# Patient Record
Sex: Female | Born: 1985 | Race: Black or African American | Hispanic: No | Marital: Single | Smoking: Current every day smoker
Health system: Southern US, Community
[De-identification: ages and names within clinical notes are randomized; demographics above are authoritative.]

## PROBLEM LIST (undated history)

## (undated) DIAGNOSIS — N139 Obstructive and reflux uropathy, unspecified: Secondary | ICD-10-CM

## (undated) DIAGNOSIS — H53001 Unspecified amblyopia, right eye: Secondary | ICD-10-CM

## (undated) DIAGNOSIS — M419 Scoliosis, unspecified: Secondary | ICD-10-CM

## (undated) DIAGNOSIS — I10 Essential (primary) hypertension: Secondary | ICD-10-CM

## (undated) HISTORY — PX: EYE SURGERY: SHX253

## (undated) HISTORY — PX: OTHER SURGICAL HISTORY: SHX169

## (undated) HISTORY — PX: REFRACTIVE SURGERY: SHX103

## (undated) HISTORY — PX: TUBAL LIGATION: SHX77

## (undated) HISTORY — PX: PILONIDAL CYST EXCISION: SHX744

---

## 2004-09-22 ENCOUNTER — Emergency Department: Payer: Self-pay | Admitting: Emergency Medicine

## 2004-11-27 ENCOUNTER — Emergency Department: Payer: Self-pay | Admitting: Emergency Medicine

## 2004-12-01 ENCOUNTER — Emergency Department: Payer: Self-pay | Admitting: Emergency Medicine

## 2005-01-14 ENCOUNTER — Emergency Department: Payer: Self-pay | Admitting: Emergency Medicine

## 2005-04-03 ENCOUNTER — Emergency Department: Payer: Self-pay | Admitting: Emergency Medicine

## 2005-05-04 ENCOUNTER — Emergency Department: Payer: Self-pay | Admitting: Emergency Medicine

## 2005-05-10 ENCOUNTER — Emergency Department: Payer: Self-pay | Admitting: General Practice

## 2005-05-11 ENCOUNTER — Ambulatory Visit: Payer: Self-pay | Admitting: Emergency Medicine

## 2005-06-06 ENCOUNTER — Emergency Department: Payer: Self-pay | Admitting: Emergency Medicine

## 2005-09-01 ENCOUNTER — Observation Stay: Payer: Self-pay

## 2005-09-13 ENCOUNTER — Emergency Department: Payer: Self-pay | Admitting: Emergency Medicine

## 2005-10-19 ENCOUNTER — Observation Stay: Payer: Self-pay | Admitting: Obstetrics and Gynecology

## 2005-11-12 ENCOUNTER — Encounter: Payer: Self-pay | Admitting: Family Medicine

## 2005-12-04 ENCOUNTER — Inpatient Hospital Stay: Payer: Self-pay

## 2006-03-18 ENCOUNTER — Emergency Department: Payer: Self-pay | Admitting: Emergency Medicine

## 2006-08-18 ENCOUNTER — Emergency Department: Payer: Self-pay | Admitting: Emergency Medicine

## 2006-11-18 ENCOUNTER — Emergency Department: Payer: Self-pay | Admitting: Internal Medicine

## 2006-12-14 ENCOUNTER — Emergency Department: Payer: Self-pay | Admitting: Emergency Medicine

## 2007-05-10 ENCOUNTER — Emergency Department: Payer: Self-pay | Admitting: Emergency Medicine

## 2007-05-12 ENCOUNTER — Emergency Department: Payer: Self-pay | Admitting: Internal Medicine

## 2009-08-12 ENCOUNTER — Emergency Department: Payer: Self-pay | Admitting: Emergency Medicine

## 2009-08-17 ENCOUNTER — Emergency Department: Payer: Self-pay | Admitting: Emergency Medicine

## 2010-05-06 ENCOUNTER — Emergency Department: Payer: Self-pay | Admitting: Emergency Medicine

## 2010-05-28 ENCOUNTER — Emergency Department: Payer: Self-pay | Admitting: Emergency Medicine

## 2010-06-12 ENCOUNTER — Emergency Department: Payer: Self-pay | Admitting: Unknown Physician Specialty

## 2010-09-22 ENCOUNTER — Emergency Department: Payer: Self-pay | Admitting: Emergency Medicine

## 2010-11-26 ENCOUNTER — Emergency Department: Payer: Self-pay | Admitting: Unknown Physician Specialty

## 2010-12-03 ENCOUNTER — Emergency Department: Payer: Self-pay | Admitting: Emergency Medicine

## 2010-12-25 ENCOUNTER — Emergency Department: Payer: Self-pay | Admitting: Emergency Medicine

## 2010-12-29 ENCOUNTER — Emergency Department: Payer: Self-pay | Admitting: Emergency Medicine

## 2011-01-14 ENCOUNTER — Emergency Department: Payer: Self-pay | Admitting: Unknown Physician Specialty

## 2011-02-06 ENCOUNTER — Emergency Department: Payer: Self-pay | Admitting: Emergency Medicine

## 2011-03-13 ENCOUNTER — Emergency Department: Payer: Self-pay | Admitting: *Deleted

## 2011-04-26 ENCOUNTER — Emergency Department: Payer: Self-pay | Admitting: Emergency Medicine

## 2011-07-02 ENCOUNTER — Emergency Department: Payer: Self-pay | Admitting: *Deleted

## 2011-09-07 ENCOUNTER — Emergency Department: Payer: Self-pay | Admitting: Emergency Medicine

## 2011-09-08 LAB — URINALYSIS, COMPLETE
Bilirubin,UR: NEGATIVE
Glucose,UR: NEGATIVE mg/dL (ref 0–75)
Leukocyte Esterase: NEGATIVE
Ph: 5 (ref 4.5–8.0)
RBC,UR: 2 /HPF (ref 0–5)
Squamous Epithelial: 3
WBC UR: 1 /HPF (ref 0–5)

## 2011-09-08 LAB — PREGNANCY, URINE: Pregnancy Test, Urine: NEGATIVE m[IU]/mL

## 2012-01-15 ENCOUNTER — Emergency Department: Payer: Self-pay | Admitting: *Deleted

## 2012-01-30 ENCOUNTER — Emergency Department: Payer: Self-pay | Admitting: Emergency Medicine

## 2012-03-11 ENCOUNTER — Emergency Department: Payer: Self-pay | Admitting: Emergency Medicine

## 2012-03-11 LAB — URINALYSIS, COMPLETE
Bilirubin,UR: NEGATIVE
Glucose,UR: NEGATIVE mg/dL (ref 0–75)
Ketone: NEGATIVE
Nitrite: NEGATIVE
Ph: 5 (ref 4.5–8.0)
Protein: NEGATIVE
Specific Gravity: 1.026 (ref 1.003–1.030)
Squamous Epithelial: 9
WBC UR: 10 /HPF (ref 0–5)

## 2012-03-11 LAB — PREGNANCY, URINE: Pregnancy Test, Urine: NEGATIVE m[IU]/mL

## 2012-03-13 LAB — URINE CULTURE

## 2012-04-23 ENCOUNTER — Emergency Department: Payer: Self-pay | Admitting: Unknown Physician Specialty

## 2012-09-21 ENCOUNTER — Emergency Department: Payer: Self-pay | Admitting: Internal Medicine

## 2012-09-23 ENCOUNTER — Emergency Department: Payer: Self-pay | Admitting: Emergency Medicine

## 2013-02-13 ENCOUNTER — Emergency Department: Payer: Self-pay | Admitting: Emergency Medicine

## 2013-03-19 ENCOUNTER — Emergency Department: Payer: Self-pay | Admitting: Emergency Medicine

## 2013-07-25 ENCOUNTER — Emergency Department: Payer: Self-pay | Admitting: Emergency Medicine

## 2013-07-25 LAB — RAPID INFLUENZA A&B ANTIGENS

## 2013-12-17 ENCOUNTER — Emergency Department: Payer: Self-pay | Admitting: Emergency Medicine

## 2013-12-17 LAB — URINALYSIS, COMPLETE
Bilirubin,UR: NEGATIVE
Blood: NEGATIVE
Glucose,UR: NEGATIVE mg/dL (ref 0–75)
Ketone: NEGATIVE
LEUKOCYTE ESTERASE: NEGATIVE
Nitrite: NEGATIVE
PH: 7 (ref 4.5–8.0)
PROTEIN: NEGATIVE
SPECIFIC GRAVITY: 1.016 (ref 1.003–1.030)
Squamous Epithelial: 11
WBC UR: 1 /HPF (ref 0–5)

## 2013-12-17 LAB — PREGNANCY, URINE: Pregnancy Test, Urine: NEGATIVE m[IU]/mL

## 2013-12-27 ENCOUNTER — Emergency Department: Payer: Self-pay | Admitting: Emergency Medicine

## 2013-12-27 LAB — COMPREHENSIVE METABOLIC PANEL
ALK PHOS: 84 U/L
ALT: 19 U/L (ref 12–78)
ANION GAP: 4 — AB (ref 7–16)
AST: 21 U/L (ref 15–37)
Albumin: 3.6 g/dL (ref 3.4–5.0)
BUN: 12 mg/dL (ref 7–18)
Bilirubin,Total: 0.2 mg/dL (ref 0.2–1.0)
CO2: 30 mmol/L (ref 21–32)
Calcium, Total: 8.9 mg/dL (ref 8.5–10.1)
Chloride: 105 mmol/L (ref 98–107)
Creatinine: 0.7 mg/dL (ref 0.60–1.30)
EGFR (Non-African Amer.): >60
Glucose: 76 mg/dL (ref 65–99)
Osmolality: 276 (ref 275–301)
Potassium: 3.6 mmol/L (ref 3.5–5.1)
Sodium: 139 mmol/L (ref 136–145)
Total Protein: 7.7 g/dL (ref 6.4–8.2)

## 2013-12-27 LAB — CBC
HCT: 36.8 % (ref 35.0–47.0)
HGB: 12 g/dL (ref 12.0–16.0)
MCH: 27.3 pg (ref 26.0–34.0)
MCHC: 32.8 g/dL (ref 32.0–36.0)
MCV: 83 fL (ref 80–100)
Platelet: 393 10*3/uL (ref 150–440)
RBC: 4.41 10*6/uL (ref 3.80–5.20)
RDW: 15.2 % — ABNORMAL HIGH (ref 11.5–14.5)
WBC: 9.1 10*3/uL (ref 3.6–11.0)

## 2013-12-27 LAB — HCG, QUANTITATIVE, PREGNANCY: Beta Hcg, Quant.: <1 m[IU]/mL — ABNORMAL LOW

## 2013-12-27 LAB — URINALYSIS, COMPLETE
Bilirubin,UR: NEGATIVE
Glucose,UR: NEGATIVE mg/dL (ref 0–75)
Ketone: NEGATIVE
Leukocyte Esterase: NEGATIVE
NITRITE: NEGATIVE
PH: 5 (ref 4.5–8.0)
SPECIFIC GRAVITY: 1.031 (ref 1.003–1.030)

## 2013-12-27 LAB — LIPASE, BLOOD: LIPASE: 124 U/L (ref 73–393)

## 2013-12-28 LAB — URINE CULTURE

## 2014-04-10 ENCOUNTER — Emergency Department: Payer: Self-pay | Admitting: Student

## 2014-04-10 LAB — URINALYSIS, COMPLETE
Bacteria: NONE SEEN
Bilirubin,UR: NEGATIVE
Glucose,UR: NEGATIVE mg/dL (ref 0–75)
Ketone: NEGATIVE
Leukocyte Esterase: NEGATIVE
Nitrite: NEGATIVE
Ph: 6 (ref 4.5–8.0)
Protein: NEGATIVE
Specific Gravity: 1.023 (ref 1.003–1.030)

## 2014-04-10 LAB — WET PREP, GENITAL

## 2014-07-18 ENCOUNTER — Emergency Department: Payer: Self-pay | Admitting: Internal Medicine

## 2014-07-18 LAB — GC/CHLAMYDIA PROBE AMP

## 2014-07-18 LAB — URINALYSIS, COMPLETE
Bilirubin,UR: NEGATIVE
GLUCOSE, UR: NEGATIVE mg/dL (ref 0–75)
KETONE: NEGATIVE
NITRITE: NEGATIVE
PH: 6 (ref 4.5–8.0)
Protein: NEGATIVE
SPECIFIC GRAVITY: 1.019 (ref 1.003–1.030)
Squamous Epithelial: 18

## 2014-07-18 LAB — WET PREP, GENITAL

## 2014-08-23 ENCOUNTER — Emergency Department: Payer: Self-pay | Admitting: Emergency Medicine

## 2014-10-31 ENCOUNTER — Emergency Department: Payer: Self-pay

## 2014-10-31 ENCOUNTER — Emergency Department
Admission: EM | Admit: 2014-10-31 | Discharge: 2014-10-31 | Disposition: A | Discharge status: Home / Self Care | Payer: Self-pay | Attending: Emergency Medicine | Admitting: Emergency Medicine

## 2014-10-31 ENCOUNTER — Encounter: Payer: Self-pay | Admitting: Emergency Medicine

## 2014-10-31 DIAGNOSIS — I10 Essential (primary) hypertension: Secondary | ICD-10-CM | POA: Insufficient documentation

## 2014-10-31 DIAGNOSIS — S8392XA Sprain of unspecified site of left knee, initial encounter: Secondary | ICD-10-CM | POA: Insufficient documentation

## 2014-10-31 DIAGNOSIS — Y998 Other external cause status: Secondary | ICD-10-CM | POA: Insufficient documentation

## 2014-10-31 DIAGNOSIS — Y9289 Other specified places as the place of occurrence of the external cause: Secondary | ICD-10-CM | POA: Insufficient documentation

## 2014-10-31 DIAGNOSIS — X58XXXA Exposure to other specified factors, initial encounter: Secondary | ICD-10-CM | POA: Insufficient documentation

## 2014-10-31 DIAGNOSIS — Y9389 Activity, other specified: Secondary | ICD-10-CM | POA: Insufficient documentation

## 2014-10-31 DIAGNOSIS — Z720 Tobacco use: Secondary | ICD-10-CM | POA: Insufficient documentation

## 2014-10-31 HISTORY — DX: Essential (primary) hypertension: I10

## 2014-10-31 HISTORY — DX: Scoliosis, unspecified: M41.9

## 2014-10-31 MED ORDER — TRAMADOL HCL 50 MG PO TABS
ORAL_TABLET | ORAL | Status: AC
  Administered 2014-10-31: 50 mg via ORAL
  Filled 2014-10-31: qty 1

## 2014-10-31 MED ORDER — TRAMADOL HCL 50 MG PO TABS: 50.0000 mg | ORAL_TABLET | Freq: Four times a day (QID) | ORAL | Status: DC | PRN

## 2014-10-31 MED ORDER — IBUPROFEN 400 MG PO TABS
ORAL_TABLET | ORAL | Status: AC
  Administered 2014-10-31: 800 mg via ORAL
  Filled 2014-10-31: qty 2

## 2014-10-31 MED ORDER — IBUPROFEN 800 MG PO TABS: 800.0000 mg | ORAL_TABLET | Freq: Three times a day (TID) | ORAL | Status: DC | PRN

## 2014-10-31 MED ORDER — TRAMADOL HCL 50 MG PO TABS
50.0000 mg | ORAL_TABLET | Freq: Once | ORAL | Status: AC
  Administered 2014-10-31: 50 mg via ORAL

## 2014-10-31 MED ORDER — IBUPROFEN 800 MG PO TABS
800.0000 mg | ORAL_TABLET | Freq: Once | ORAL | Status: AC
  Administered 2014-10-31: 800 mg via ORAL

## 2014-10-31 NOTE — Discharge Instructions (Signed)
Cryotherapy Cryotherapy is when you put ice on your injury. Ice helps lessen pain and puffiness (swelling) after an injury. Ice works the best when you start using it in the first 24 to 48 hours after an injury. HOME CARE  Put a dry or damp towel between the ice pack and your skin.  You may press gently on the ice pack.  Leave the ice on for no more than 10 to 20 minutes at a time.  Check your skin after 5 minutes to make sure your skin is okay.  Rest at least 20 minutes between ice pack uses.  Stop using ice when your skin loses feeling (numbness).  Do not use ice on someone who cannot tell you when it hurts. This includes small children and people with memory problems (dementia). GET HELP RIGHT AWAY IF:  You have white spots on your skin.  Your skin turns blue or pale.  Your skin feels waxy or hard.  Your puffiness gets worse. MAKE SURE YOU:   Understand these instructions.  Will watch your condition.  Will get help right away if you are not doing well or get worse. Document Released: 11/28/2007 Document Revised: 09/03/2011 Document Reviewed: 02/01/2011 [facility] Patient Information 2015 [place], [place]. This information is not intended to replace advice given to you by your health care provider. Make sure you discuss any questions you have with your health care provider.  Knee Bracing Knee braces are supports to help stabilize and protect an injured or painful knee. They come in many different styles. They should support and protect the knee without increasing the chance of other injuries to yourself or others. It is important not to have a false sense of security when using a brace. Knee braces that help you to keep using your knee:  Do not restore normal knee stability under high stress forces.  May decrease some aspects of athletic performance. Some of the different types of knee braces are:  Prophylactic knee braces are designed to prevent or reduce the severity of  knee injuries during sports that make injury to the knee more likely.  Rehabilitative knee braces are designed to allow protected motion of:  Injured knees.  Knees that have been treated with or without surgery. There is no evidence that the use of a supportive knee brace protects the graft following a successful anterior cruciate ligament (ACL) reconstruction. However, braces are sometimes used to:   Protect injured ligaments.  Control knee movement during the initial healing period. They may be used as part of the treatment program for the various injured ligaments or cartilage of the knee including the:  Anterior cruciate ligament.  Medial collateral ligament.  Medial or lateral cartilage (meniscus).  Posterior cruciate ligament.  Lateral collateral ligament. Rehabilitative knee braces are most commonly used:  During crutch-assisted walking right after injury.  During crutch-assisted walking right after surgery to repair the cartilage and/or cruciate ligament injury.  For a short period of time, 2-8 weeks, after the injury or surgery. The value of a rehabilitative brace as opposed to a cast or splint includes the:  Ability to adjust the brace for swelling.  Ability to remove the brace for examinations, icing, or showering.  Ability to allow for movement in a controlled range of motion. Functional knee braces give support to knees that have already been injured. They are designed to provide stability for the injured knee and provide protection after repair. Functional knee braces may not affect performance much. Lower extremity muscle  strengthening, flexibility, and improvement in technique are more important than bracing in treating ligamentous knee injuries. Functional braces are not a substitute for rehabilitation or surgical procedures. Unloader/off-loader braces are designed to provide pain relief in arthritic knees. Patients with wear and tear arthritis from growing old  or from an old cartilage injury (osteoarthritis) of the knee, and bowlegged (varus) or knock-knee (valgus) deformities, often develop increased pain in the arthritic side due to increased loading. Unloader/off-loader braces are made to reduce uneven loading in such knees. There is reduction in bowing out movement in bowlegged knees when the correct unloader brace is used. Patients with advanced osteoarthritis or severe varus or valgus alignment problems would not likely benefit from bracing. Patellofemoral braces help the kneecap to move smoothly and well centered over the end of the femur in the knee.  Most people who wear knee braces feel that they help. However, there is a lack of scientific evidence that knee braces are helpful at the level needed for athletic participation to prevent injury. In spite of this, athletes report an increase in knee stability, pain relief, performance improvement, and confidence during athletics when using a brace.  Different knee problems require different knee braces:  Your caregiver may suggest one kind of knee brace after knee surgery.  A caregiver may choose another kind of knee brace for support instead of surgery for some types of torn ligaments.  You may also need one for pain in the front of your knee that is not getting better with strengthening and flexibility exercises. Get your caregiver's advice if you want to try a knee brace. The caregiver will advise you on where to get them and provide a prescription when it is needed to fashion and/or fit the brace. Knee braces are the least important part of preventing knee injuries or getting better following injury. Stretching, strengthening and technique improvement are far more important in caring for and preventing knee injuries. When strengthening your knee, increase your activities a little at a time so as not to develop injuries from overuse. Work out an exercise plan with your caregiver and/or physical  therapist to get the best program for you. Do not let a knee brace become a crutch. Always remember, there are no braces which support the knee as well as your original ligaments and cartilage you were born with. Conditioning, proper warm-up, and stretching remain the most important parts of keeping your knees healthy. HOW TO USE A KNEE BRACE  During sports, knee braces should be used as directed by your caregiver.  Make sure that the hinges are where the knee bends.  Straps, tapes, or hook-and-loop tapes should be fastened around your leg as instructed.  You should check the placement of the brace during activities to make sure that it has not moved. Poorly positioned braces can hurt rather than help you.  To work well, a knee brace should be worn during all activities that put you at risk of knee injury.  Warm up properly before beginning athletic activities. HOME CARE INSTRUCTIONS  Knee braces often get damaged during normal use. Replace worn-out braces for maximum benefit.  Clean regularly with soap and water.  Inspect your brace often for wear and tear.  Cover exposed metal to protect others from injury.  Durable materials may cost more, but last longer. SEEK IMMEDIATE MEDICAL CARE IF:   Your knee seems to be getting worse rather than better.  You have increasing pain or swelling in the knee.  You  have problems caused by the knee brace.  You have increased swelling or inflammation (redness or soreness) in your knee.  Your knee becomes warm and more painful and you develop an unexplained temperature over 101F (38.3C). MAKE SURE YOU:   Understand these instructions.  Will watch your condition.  Will get help right away if you are not doing well or get worse. See your caregiver, physical therapist, or orthopedic surgeon for additional information. Document Released: 09/01/2003 Document Revised: 10/26/2013 Document Reviewed: 12/08/2008 [facility] Patient Information  2015 [place], [place]. This information is not intended to replace advice given to you by your health care provider. Make sure you discuss any questions you have with your health care provider.

## 2014-10-31 NOTE — ED Notes (Signed)
Pt reports left knee pain and swelling after down about 8 stairs. Ambulatory with pain. Denies any other injuries. Denies hitting head or LOC>

## 2014-10-31 NOTE — ED Provider Notes (Signed)
[facility] Emergency Department Provider Note  ____________________________________________  Time seen: Approximately 1809  I have reviewed the triage vital signs and the nursing notes.   HISTORY  Chief Complaint Knee Pain    HPI Maria Tapia is a 29 y.o. female that fell down approximately 30 minutes prior to arrival twisting her left knee complains of pain with extension and any type of movement of the knee is here for evaluation rates pain as about 8 to a 10 out of 10 depending on she moves it relieved with rest denies any numbness tingling weakness in the extremity otherwise has no complaints at this time   Past Medical History  Diagnosis Date  . Hypertension   . Scoliosis     There are no active problems to display for this patient.   Past Surgical History  Procedure Laterality Date  . C-cection       Current Outpatient Rx  Name  Route  Sig  Dispense  Refill  . ibuprofen (ADVIL,MOTRIN) 800 MG tablet   Oral   Take 1 tablet (800 mg total) by mouth every 8 (eight) hours as needed.   30 tablet   0   . traMADol (ULTRAM) 50 MG tablet   Oral   Take 1 tablet (50 mg total) by mouth every 6 (six) hours as needed.   12 tablet   0     Allergies Review of patient's allergies indicates no known allergies.  History reviewed. No pertinent family history.  Social History History  Substance Use Topics  . Smoking status: Current Some Day Smoker -- 1.00 packs/day    Types: Cigarettes  . Smokeless tobacco: Not on file  . Alcohol Use: No    Review of Systems Constitutional: No fever/chills Eyes: No visual changes. ENT: No sore throat. Cardiovascular: Denies chest pain. Respiratory: Denies shortness of breath. Gastrointestinal: No abdominal pain.  No nausea, no vomiting.  No diarrhea.  No constipation. Genitourinary: Negative for dysuria. Musculoskeletal: Negative for back pain. Skin: Negative for rash. Neurological: Negative for  headaches, focal weakness or numbness.  10-point ROS otherwise negative.  ____________________________________________   PHYSICAL EXAM:  VITAL SIGNS: ED Triage Vitals  Enc Vitals Group     BP 10/31/14 1741 147/92 mmHg     Pulse Rate 10/31/14 1741 86     Resp 10/31/14 1741 20     Temp 10/31/14 1741 98.8 F (37.1 C)     Temp Source 10/31/14 1741 Oral     SpO2 10/31/14 1741 97 %     Weight 10/31/14 1741 200 lb (90.719 kg)     Height 10/31/14 1741 5' (1.524 m)     Head Cir --      Peak Flow --      Pain Score 10/31/14 1742 10     Pain Loc --      Pain Edu? --      Excl. in GC? --     Constitutional: Alert and oriented. Well appearing and in no acute distress. Eyes: Conjunctivae are normal. PERRL. EOMI. Head: Atraumatic. Nose: No congestion/rhinnorhea. Mouth/Throat: Mucous membranes are moist.  Oropharynx non-erythematous. Neck: No stridor.   Cardiovascular: Normal rate, regular rhythm. Grossly normal heart sounds.  Good peripheral circulation. Respiratory: Normal respiratory effort.  No retractions. Lungs CTAB.  Musculoskeletal: Tenderness with palpation to the medial and anterior aspect of the left knee does have full range of motion passively with pain limited range of motion actively due to pain below the knee she has  full function good distal pulses and strength equal and symmetrical bilaterally Neurologic:  Normal speech and language. No gross focal neurologic deficits are appreciated. Speech is normal. No gait instability. Skin:  Skin is warm, dry and intact. No rash noted. Psychiatric: Mood and affect are normal. Speech and behavior are normal.  ____________________________________________    RADIOLOGY  X-rays of the patient left knee was negative ____________________________________________   PROCEDURES  Procedure(s) performed: None  Critical Care performed: No  ____________________________________________   INITIAL IMPRESSION / ASSESSMENT AND PLAN /  ED COURSE  Pertinent labs & imaging results that were available during my care of the patient were reviewed by me and considered in my medical decision making (see chart for details).   impression this patient left knee sprain patient was given an ice pack in the department given Motrin and tramadol will be placed in a knee immobilizer will be discharged home follow up with orthopedics as soon as possible returning here for any acute concerns or worsening symptoms ____________________________________________   FINAL CLINICAL IMPRESSION(S) / ED DIAGNOSES  Final diagnoses:  Knee sprain, left, initial encounter     [name] Rosalyn GessWilliam C Anayeli Arel, PA-C 10/31/14 1943  Sharman CheekPhillip Stafford, MD 11/03/14 [phone]

## 2014-11-15 ENCOUNTER — Encounter: Payer: Self-pay | Admitting: Emergency Medicine

## 2014-11-15 ENCOUNTER — Emergency Department
Admission: EM | Admit: 2014-11-15 | Discharge: 2014-11-15 | Disposition: A | Discharge status: Home / Self Care | Payer: Self-pay | Attending: Emergency Medicine | Admitting: Emergency Medicine

## 2014-11-15 DIAGNOSIS — H9202 Otalgia, left ear: Secondary | ICD-10-CM | POA: Insufficient documentation

## 2014-11-15 DIAGNOSIS — I10 Essential (primary) hypertension: Secondary | ICD-10-CM | POA: Insufficient documentation

## 2014-11-15 DIAGNOSIS — B9689 Other specified bacterial agents as the cause of diseases classified elsewhere: Secondary | ICD-10-CM | POA: Insufficient documentation

## 2014-11-15 DIAGNOSIS — Z880 Allergy status to penicillin: Secondary | ICD-10-CM | POA: Insufficient documentation

## 2014-11-15 DIAGNOSIS — Z79899 Other long term (current) drug therapy: Secondary | ICD-10-CM | POA: Insufficient documentation

## 2014-11-15 DIAGNOSIS — Z720 Tobacco use: Secondary | ICD-10-CM | POA: Insufficient documentation

## 2014-11-15 DIAGNOSIS — J028 Acute pharyngitis due to other specified organisms: Secondary | ICD-10-CM | POA: Insufficient documentation

## 2014-11-15 LAB — POCT RAPID STREP A
STREPTOCOCCUS, GROUP A SCREEN (DIRECT): NEGATIVE
Streptococcus, Group A Screen (Direct): NEGATIVE

## 2014-11-15 MED ORDER — BENZONATATE 100 MG PO CAPS: 100.0000 mg | ORAL_CAPSULE | Freq: Three times a day (TID) | ORAL | Status: AC | PRN

## 2014-11-15 MED ORDER — AZITHROMYCIN 200 MG/5ML PO SUSR
500.0000 mg | Freq: Once | ORAL | Status: AC
  Administered 2014-11-15: 500 mg via ORAL

## 2014-11-15 MED ORDER — IBUPROFEN 800 MG PO TABS: 800.0000 mg | ORAL_TABLET | Freq: Three times a day (TID) | ORAL | Status: DC | PRN

## 2014-11-15 MED ORDER — AZITHROMYCIN 250 MG PO TABS: 250.0000 mg | ORAL_TABLET | Freq: Every day | ORAL | Status: DC

## 2014-11-15 MED ORDER — AZITHROMYCIN 200 MG/5ML PO SUSR
ORAL | Status: AC
  Administered 2014-11-15: 500 mg via ORAL
  Filled 2014-11-15: qty 1

## 2014-11-15 MED ORDER — AZITHROMYCIN 250 MG PO TABS
ORAL_TABLET | ORAL | Status: DC
Start: 2014-11-15 — End: 2015-12-29

## 2014-11-15 NOTE — ED Notes (Signed)
Pt c/o sore throat and left earache since Friday; says she had a fever ov 103.6 about 45 minutes prior to arrival; did not take any medication; temp currently in triage 98.6;

## 2014-11-15 NOTE — ED Provider Notes (Signed)
[facility] Emergency Department Provider Note  ____________________________________________  Time seen: Approximately 7:22 AM  I have reviewed the triage vital signs and the nursing notes.   HISTORY  Chief Complaint Sore Throat and Otalgia    HPI Maria Tapia is a 29 y.o. female Maria Tapia complains of sore throat and left ear aches 4 days. States she had a fever of 103.6 about 45 minutes prior to arrival. Vital signs noted and reviewed.  Past Medical History  Diagnosis Date  . Hypertension   . Scoliosis     There are no active problems to display for this patient.   Past Surgical History  Procedure Laterality Date  . C-cection     . Eye surgery      Current Outpatient Rx  Name  Route  Sig  Dispense  Refill  . losartan (COZAAR) 25 MG tablet   Oral   Take 25 mg by mouth 2 (two) times daily.         Marland Kitchen azithromycin (ZITHROMAX Z-PAK) 250 MG tablet      Take 2 tablets (500 mg) on  Day 1,  followed by 1 tablet (250 mg) once daily on Days 2 through 5.   6 each   0   . benzonatate (TESSALON PERLES) 100 MG capsule   Oral   Take 1 capsule (100 mg total) by mouth 3 (three) times daily as needed for cough.   30 capsule   0   . ibuprofen (ADVIL,MOTRIN) 800 MG tablet   Oral   Take 1 tablet (800 mg total) by mouth every 8 (eight) hours as needed.   30 tablet   0     Allergies Amoxicillin and Penicillins  No family history on file.  Social History History  Substance Use Topics  . Smoking status: Current Some Day Smoker -- 1.00 packs/day    Types: Cigarettes  . Smokeless tobacco: Never Used  . Alcohol Use: No    Review of Systems Constitutional: No fever/chills Eyes: No visual changes. ENT: Positive sore throat. Cardiovascular: Denies chest pain. Respiratory: Denies shortness of breath. Positive for dry cough Gastrointestinal: No abdominal pain.  No nausea, no vomiting.  No diarrhea.  No constipation. Genitourinary: Negative for  dysuria. Musculoskeletal: Negative for back pain. Skin: Negative for rash. Neurological: Negative for headaches, focal weakness or numbness.  10-point ROS otherwise negative.  ____________________________________________   PHYSICAL EXAM:  VITAL SIGNS: ED Triage Vitals  Enc Vitals Group     BP 11/15/14 0319 144/99 mmHg     Pulse Rate 11/15/14 0319 94     Resp 11/15/14 0319 18     Temp 11/15/14 0319 98.6 F (37 C)     Temp Source 11/15/14 0319 Oral     SpO2 11/15/14 0319 100 %     Weight 11/15/14 0319 205 lb (92.987 kg)     Height 11/15/14 0319  (1.499 m)     Head Cir --      Peak Flow --      Pain Score 11/15/14 0320 10     Pain Loc --      Pain Edu? --      Excl. in GC? --     Constitutional: Alert and oriented. Well appearing and in no acute distress. Eyes: Conjunctivae are normal. PERRL. EOMI. Head: Atraumatic. Nose: No congestion/rhinnorhea. Mouth/Throat: Mucous membranes are moist.  Oropharynx very erythematous with tonsillar edema. Neck: No stridor.   Hematological/Lymphatic/Immunilogical: No cervical lymphadenopathy. Cardiovascular: Normal rate, regular rhythm.  Grossly normal heart sounds.  Good peripheral circulation. Respiratory: Normal respiratory effort.  No retractions. Lungs CTAB. Gastrointestinal: Soft and nontender. No distention. No abdominal bruits. No CVA tenderness. Musculoskeletal: No lower extremity tenderness nor edema.  No joint effusions. Neurologic:  Normal speech and language. No gross focal neurologic deficits are appreciated. Speech is normal. No gait instability. Skin:  Skin is warm, dry and intact. No rash noted. Psychiatric: Mood and affect are normal. Speech and behavior are normal.  ____________________________________________   LABS (all labs ordered are listed, but only abnormal results are displayed)  Labs Reviewed  CULTURE, GROUP A STREP ([facility])  POCT RAPID STREP A  POCT RAPID STREP A    ____________________________________________  EKG  None ____________________________________________  RADIOLOGY  None ____________________________________________   PROCEDURES  Procedure(s) performed: None  Critical Care performed: No  ____________________________________________   INITIAL IMPRESSION / ASSESSMENT AND PLAN / ED COURSE  Pertinent labs & imaging results that were available during my care of the patient were reviewed by me and considered in my medical decision making (see chart for details).  Despite negative rapid A strep, plan is to treat for strep pharyngitis accordingly. Rx given for Zithromax Tessalon Perles and ibuprofen ____________________________________________   FINAL CLINICAL IMPRESSION(S) / ED DIAGNOSES  Final diagnoses:  Acute pharyngitis due to other specified organisms      Evangeline Dakinharles M Arville Postlewaite, PA-C 11/15/14 1547  Jene Everyobert Kinner, MD 11/17/14 1258

## 2014-11-15 NOTE — Discharge Instructions (Signed)
Pharyngitis °Pharyngitis is redness, pain, and swelling (inflammation) of your pharynx.  °CAUSES  °Pharyngitis is usually caused by infection. Most of the time, these infections are from viruses (viral) and are part of a cold. However, sometimes pharyngitis is caused by bacteria (bacterial). Pharyngitis can also be caused by allergies. Viral pharyngitis may be spread from person to person by coughing, sneezing, and personal items or utensils (cups, forks, spoons, toothbrushes). Bacterial pharyngitis may be spread from person to person by more intimate contact, such as kissing.  °SIGNS AND SYMPTOMS  °Symptoms of pharyngitis include:   °· Sore throat.   °· Tiredness (fatigue).   °· Low-grade fever.   °· Headache. °· Joint pain and muscle aches. °· Skin rashes. °· Swollen lymph nodes. °· Plaque-like film on throat or tonsils (often seen with bacterial pharyngitis). °DIAGNOSIS  °Your health care provider will ask you questions about your illness and your symptoms. Your medical history, along with a physical exam, is often all that is needed to diagnose pharyngitis. Sometimes, a rapid strep test is done. Other lab tests may also be done, depending on the suspected cause.  °TREATMENT  °Viral pharyngitis will usually get better in 3-4 days without the use of medicine. Bacterial pharyngitis is treated with medicines that kill germs (antibiotics).  °HOME CARE INSTRUCTIONS  °· Drink enough water and fluids to keep your urine clear or pale yellow.   °· Only take over-the-counter or prescription medicines as directed by your health care provider:   °¨ If you are prescribed antibiotics, make sure you finish them even if you start to feel better.   °¨ Do not take aspirin.   °· Get lots of rest.   °· Gargle with 8 oz of salt water (½ tsp of salt per 1 qt of water) as often as every 1-2 hours to soothe your throat.   °· Throat lozenges (if you are not at risk for choking) or sprays may be used to soothe your throat. °SEEK MEDICAL  CARE IF:  °· You have large, tender lumps in your neck. °· You have a rash. °· You cough up green, yellow-brown, or bloody spit. °SEEK IMMEDIATE MEDICAL CARE IF:  °· Your neck becomes stiff. °· You drool or are unable to swallow liquids. °· You vomit or are unable to keep medicines or liquids down. °· You have severe pain that does not go away with the use of recommended medicines. °· You have trouble breathing (not caused by a stuffy nose). °MAKE SURE YOU:  °· Understand these instructions. °· Will watch your condition. °· Will get help right away if you are not doing well or get worse. °Document Released: 06/11/2005 Document Revised: 04/01/2013 Document Reviewed: 02/16/2013 °ExitCare® Patient Information ©2015 [employer]. This information is not intended to replace advice given to you by your health care provider. Make sure you discuss any questions you have with your health care provider. ° °Upper Respiratory Infection, Adult °An upper respiratory infection (URI) is also sometimes known as the common cold. The upper respiratory tract includes the nose, sinuses, throat, trachea, and bronchi. Bronchi are the airways leading to the lungs. Most people improve within 1 week, but symptoms can last up to 2 weeks. A residual cough may last even longer.  °CAUSES °Many different viruses can infect the tissues lining the upper respiratory tract. The tissues become irritated and inflamed and often become very moist. Mucus production is also common. A cold is contagious. You can easily spread the virus to others by oral contact. This includes kissing, sharing a glass,   coughing, or sneezing. Touching your mouth or nose and then touching a surface, which is then touched by another person, can also spread the virus. °SYMPTOMS  °Symptoms typically develop 1 to 3 days after you come in contact with a cold virus. Symptoms vary from person to person. They may include: °· Runny nose. °· Sneezing. °· Nasal congestion. °· Sinus  irritation. °· Sore throat. °· Loss of voice (laryngitis). °· Cough. °· Fatigue. °· Muscle aches. °· Loss of appetite. °· Headache. °· Low-grade fever. °DIAGNOSIS  °You might diagnose your own cold based on familiar symptoms, since most people get a cold 2 to 3 times a year. Your caregiver can confirm this based on your exam. Most importantly, your caregiver can check that your symptoms are not due to another disease such as strep throat, sinusitis, pneumonia, asthma, or epiglottitis. Blood tests, throat tests, and X-rays are not necessary to diagnose a common cold, but they may sometimes be helpful in excluding other more serious diseases. Your caregiver will decide if any further tests are required. °RISKS AND COMPLICATIONS  °You may be at risk for a more severe case of the common cold if you smoke cigarettes, have chronic heart disease (such as heart failure) or lung disease (such as asthma), or if you have a weakened immune system. The very young and very old are also at risk for more serious infections. Bacterial sinusitis, middle ear infections, and bacterial pneumonia can complicate the common cold. The common cold can worsen asthma and chronic obstructive pulmonary disease (COPD). Sometimes, these complications can require emergency medical care and may be life-threatening. °PREVENTION  °The best way to protect against getting a cold is to practice good hygiene. Avoid oral or hand contact with people with cold symptoms. Wash your hands often if contact occurs. There is no clear evidence that vitamin C, vitamin E, echinacea, or exercise reduces the chance of developing a cold. However, it is always recommended to get plenty of rest and practice good nutrition. °TREATMENT  °Treatment is directed at relieving symptoms. There is no cure. Antibiotics are not effective, because the infection is caused by a virus, not by bacteria. Treatment may include: °· Increased fluid intake. Sports drinks offer valuable  electrolytes, sugars, and fluids. °· Breathing heated mist or steam (vaporizer or shower). °· Eating chicken soup or other clear broths, and maintaining good nutrition. °· Getting plenty of rest. °· Using gargles or lozenges for comfort. °· Controlling fevers with ibuprofen or acetaminophen as directed by your caregiver. °· Increasing usage of your inhaler if you have asthma. °Zinc gel and zinc lozenges, taken in the first 24 hours of the common cold, can shorten the duration and lessen the severity of symptoms. Pain medicines may help with fever, muscle aches, and throat pain. A variety of non-prescription medicines are available to treat congestion and runny nose. Your caregiver can make recommendations and may suggest nasal or lung inhalers for other symptoms.  °HOME CARE INSTRUCTIONS  °· Only take over-the-counter or prescription medicines for pain, discomfort, or fever as directed by your caregiver. °· Use a warm mist humidifier or inhale steam from a shower to increase air moisture. This may keep secretions moist and make it easier to breathe. °· Drink enough water and fluids to keep your urine clear or pale yellow. °· Rest as needed. °· Return to work when your temperature has returned to normal or as your caregiver advises. You may need to stay home longer to avoid infecting others. You   can also use a face mask and careful hand washing to prevent spread of the virus. °SEEK MEDICAL CARE IF:  °· After the first few days, you feel you are getting worse rather than better. °· You need your caregiver's advice about medicines to control symptoms. °· You develop chills, worsening shortness of breath, or brown or red sputum. These may be signs of pneumonia. °· You develop yellow or brown nasal discharge or pain in the face, especially when you bend forward. These may be signs of sinusitis. °· You develop a fever, swollen neck glands, pain with swallowing, or white areas in the back of your throat. These may be signs  of strep throat. °SEEK IMMEDIATE MEDICAL CARE IF:  °· You have a fever. °· You develop severe or persistent headache, ear pain, sinus pain, or chest pain. °· You develop wheezing, a prolonged cough, cough up blood, or have a change in your usual mucus (if you have chronic lung disease). °· You develop sore muscles or a stiff neck. °Document Released: 12/05/2000 Document Revised: 09/03/2011 Document Reviewed: 09/16/2013 °ExitCare® Patient Information ©2015 [employer]. This information is not intended to replace advice given to you by your health care provider. Make sure you discuss any questions you have with your health care provider. ° °

## 2014-11-15 NOTE — ED Notes (Signed)
NEGATIVE RAPID STREP TEST

## 2014-11-17 LAB — CULTURE, GROUP A STREP (THRC)

## 2014-11-22 ENCOUNTER — Encounter: Payer: Self-pay | Admitting: Emergency Medicine

## 2014-11-22 ENCOUNTER — Emergency Department
Admission: EM | Admit: 2014-11-22 | Discharge: 2014-11-22 | Disposition: A | Discharge status: Home / Self Care | Payer: Self-pay | Attending: Emergency Medicine | Admitting: Emergency Medicine

## 2014-11-22 DIAGNOSIS — I10 Essential (primary) hypertension: Secondary | ICD-10-CM | POA: Insufficient documentation

## 2014-11-22 DIAGNOSIS — Z79899 Other long term (current) drug therapy: Secondary | ICD-10-CM | POA: Insufficient documentation

## 2014-11-22 DIAGNOSIS — L0231 Cutaneous abscess of buttock: Secondary | ICD-10-CM | POA: Insufficient documentation

## 2014-11-22 DIAGNOSIS — Z880 Allergy status to penicillin: Secondary | ICD-10-CM | POA: Insufficient documentation

## 2014-11-22 DIAGNOSIS — Z720 Tobacco use: Secondary | ICD-10-CM | POA: Insufficient documentation

## 2014-11-22 MED ORDER — SULFAMETHOXAZOLE-TRIMETHOPRIM 800-160 MG PO TABS: 1.0000 | ORAL_TABLET | Freq: Two times a day (BID) | ORAL | Status: DC

## 2014-11-22 MED ORDER — HYDROCODONE-ACETAMINOPHEN 5-325 MG PO TABS
ORAL_TABLET | ORAL | Status: AC
  Administered 2014-11-22: 1 via ORAL
  Filled 2014-11-22: qty 1

## 2014-11-22 MED ORDER — HYDROCODONE-ACETAMINOPHEN 5-325 MG PO TABS: 1.0000 | ORAL_TABLET | ORAL | Status: DC | PRN

## 2014-11-22 MED ORDER — HYDROCODONE-ACETAMINOPHEN 5-325 MG PO TABS
1.0000 | ORAL_TABLET | Freq: Once | ORAL | Status: AC
  Administered 2014-11-22: 1 via ORAL

## 2014-11-22 NOTE — ED Provider Notes (Signed)
[facility] Emergency Department Provider Note  ____________________________________________  Time seen: 1405  I have reviewed the triage vital signs and the nursing notes.   HISTORY  Chief Complaint Abscess   HPI Maria Tapia is a 29 y.o. female comes in today with complaint of an abscess on her buttocks for the last 2 or 3 days. She was recently seen for strep throat and states she understand why she has an abscess on her butt now. She rates her pain as a 9 out of 10.She is not taking any medication for this, but does want pain medication at present. She was recently seen for strep throat. She states she did finish that medication.   Past Medical History  Diagnosis Date  . Hypertension   . Scoliosis     There are no active problems to display for this patient.   Past Surgical History  Procedure Laterality Date  . C-cection     . Eye surgery      Current Outpatient Rx  Name  Route  Sig  Dispense  Refill  . azithromycin (ZITHROMAX Z-PAK) 250 MG tablet      Take 2 tablets (500 mg) on  Day 1,  followed by 1 tablet (250 mg) once daily on Days 2 through 5.   6 each   0   . benzonatate (TESSALON PERLES) 100 MG capsule   Oral   Take 1 capsule (100 mg total) by mouth 3 (three) times daily as needed for cough.   30 capsule   0   . HYDROcodone-acetaminophen (NORCO/VICODIN) 5-325 MG per tablet   Oral   Take 1 tablet by mouth every 4 (four) hours as needed for moderate pain.   20 tablet   0   . ibuprofen (ADVIL,MOTRIN) 800 MG tablet   Oral   Take 1 tablet (800 mg total) by mouth every 8 (eight) hours as needed.   30 tablet   0   . losartan (COZAAR) 25 MG tablet   Oral   Take 25 mg by mouth 2 (two) times daily.         Marland Kitchen sulfamethoxazole-trimethoprim (BACTRIM DS,SEPTRA DS) 800-160 MG per tablet   Oral   Take 1 tablet by mouth 2 (two) times daily.   20 tablet   0     Allergies Amoxicillin and Penicillins  No family history on  file.  Social History History  Substance Use Topics  . Smoking status: Current Some Day Smoker -- 1.00 packs/day    Types: Cigarettes  . Smokeless tobacco: Never Used  . Alcohol Use: No    Review of Systems Constitutional: No fever/chills Eyes: No visual changes. ENT: Positive for recent sore throat. Cardiovascular: Denies chest pain. Respiratory: Denies shortness of breath. Genitourinary: Negative for dysuria. Musculoskeletal: Negative for back pain. Skin: Negative for rash. Neurological: Negative for headaches 10-point ROS otherwise negative.  ____________________________________________   PHYSICAL EXAM:  VITAL SIGNS: ED Triage Vitals  Enc Vitals Group     BP 11/22/14 1336 147/99 mmHg     Pulse Rate 11/22/14 1336 79     Resp 11/22/14 1336 18     Temp 11/22/14 1336 99 F (37.2 C)     Temp Source 11/22/14 1336 Oral     SpO2 11/22/14 1336 97 %     Weight 11/22/14 1336 200 lb (90.719 kg)     Height 11/22/14 1336  (1.499 m)     Head Cir --      Peak Flow --  Pain Score 11/22/14 1337 9     Pain Loc --      Pain Edu? --      Excl. in GC? --     Constitutional: Alert and oriented. Well appearing and in no acute distress. Eyes: Conjunctivae are normal. PERRL. EOMI. Head: Atraumatic. Nose: No congestion/rhinnorhea. Neck: No stridor.   Hematological/Lymphatic/Immunilogical: No cervical lymphadenopathy. Cardiovascular: Normal rate, regular rhythm. Grossly normal heart sounds.  Good peripheral circulation. Respiratory: Normal respiratory effort.  No retractions. Lungs CTAB. Gastrointestinal: Soft and nontender. No distention. No abdominal bruits. No CVA tenderness. Musculoskeletal: No lower extremity tenderness nor edema.  No joint effusions. Neurologic:  Normal speech and language. No gross focal neurologic deficits are appreciated. Speech is normal. No gait instability. Skin:  Skin is warm, dry.  There is a tender area on the medial aspect of her right  buttocks. There is no abscess formation at this time or fluctuant area. There is moderate tenderness and hard on palpation  Psychiatric: Mood and affect are normal. Speech and behavior are normal.  ____________________________________________   LABS (all labs ordered are listed, but only abnormal results are displayed)  Labs Reviewed - No data to display   PROCEDURES  Procedure(s) performed: None  Critical Care performed: No  ____________________________________________   INITIAL IMPRESSION / ASSESSMENT AND PLAN / ED COURSE  Pertinent labs & imaging results that were available during my care of the patient were reviewed by me and considered in my medical decision making (see chart for details).  Patient was started on anabolic consumption for pain. She is to either sit on top warm water use warm compresses. She'll return if area looks worse. She was also given a list of clinics in the area for a family doctor. She states [facility] even though she doesn't go there anymore. ____________________________________________   FINAL CLINICAL IMPRESSION(S) / ED DIAGNOSES  Final diagnoses:  Abscess of buttock, right      Tommi RumpsRhonda L Summers, PA-C 11/22/14 1611  Darien Ramusavid W Kaminski, MD 11/23/14 1213

## 2014-11-22 NOTE — Discharge Instructions (Signed)
Abscess An abscess (boil or furuncle) is an infected area on or under the skin. This area is filled with yellowish-white fluid (pus) and other material (debris). HOME CARE   Only take medicines as told by your doctor.  If you were given antibiotic medicine, take it as directed. Finish the medicine even if you start to feel better.  If gauze is used, follow your doctor's directions for changing the gauze.  To avoid spreading the infection:  Keep your abscess covered with a bandage.  Wash your hands well.  Do not share personal care items, towels, or whirlpools with others.  Avoid skin contact with others.  Keep your skin and clothes clean around the abscess.  Keep all doctor visits as told. GET HELP RIGHT AWAY IF:   You have more pain, puffiness (swelling), or redness in the wound site.  You have more fluid or blood coming from the wound site.  You have muscle aches, chills, or you feel sick.  You have a fever. MAKE SURE YOU:   Understand these instructions.  Will watch your condition.  Will get help right away if you are not doing well or get worse. Document Released: 11/28/2007 Document Revised: 12/11/2011 Document Reviewed: 08/24/2011 [facility] Patient Information 2015 [place] [place]. This information is not intended to replace advice given to you by your health care provider. Make sure you discuss any questions you have with your health care provider.    MAKE AN APPOINTMENT WITH ONE OF THE CLINICS LISTED ON YOUR PAPERS.  YOU WILL NEED TO HAVE YOUR BLOOD PRESSURE RE-CHECKED IN 4-5 DAYS.   TAKE ANTIBIOTIC UNTIL FINISHED.  SOAK IN TUB OF WARM WATER TWICE A DAY OR USE MOIST WARM COMPRESSES TO THE AREA.  TAKE PAIN MEDIATION ONLY AS DIRECTED RETURN TO ER IN 2 DAY IS ANY SOFTNESS TO THE AREA FOR POSSIBLE INCISION AND DRAINAGE

## 2014-11-22 NOTE — ED Notes (Signed)
Poss abscess to buttocks and fever for the past 2-3 days

## 2014-11-24 ENCOUNTER — Emergency Department
Admission: EM | Admit: 2014-11-24 | Discharge: 2014-11-24 | Disposition: A | Discharge status: Home / Self Care | Payer: Self-pay | Attending: Emergency Medicine | Admitting: Emergency Medicine

## 2014-11-24 ENCOUNTER — Encounter: Payer: Self-pay | Admitting: Emergency Medicine

## 2014-11-24 DIAGNOSIS — Z3202 Encounter for pregnancy test, result negative: Secondary | ICD-10-CM | POA: Insufficient documentation

## 2014-11-24 DIAGNOSIS — Z79899 Other long term (current) drug therapy: Secondary | ICD-10-CM | POA: Insufficient documentation

## 2014-11-24 DIAGNOSIS — I10 Essential (primary) hypertension: Secondary | ICD-10-CM | POA: Insufficient documentation

## 2014-11-24 DIAGNOSIS — Z792 Long term (current) use of antibiotics: Secondary | ICD-10-CM | POA: Insufficient documentation

## 2014-11-24 DIAGNOSIS — L03317 Cellulitis of buttock: Secondary | ICD-10-CM | POA: Insufficient documentation

## 2014-11-24 DIAGNOSIS — Z720 Tobacco use: Secondary | ICD-10-CM | POA: Insufficient documentation

## 2014-11-24 LAB — COMPREHENSIVE METABOLIC PANEL
ALBUMIN: 3.4 g/dL — AB (ref 3.5–5.0)
ALK PHOS: 69 U/L (ref 38–126)
ALT: 10 U/L — AB (ref 14–54)
AST: 15 U/L (ref 15–41)
Anion gap: 12 (ref 5–15)
BUN: 10 mg/dL (ref 6–20)
CO2: 26 mmol/L (ref 22–32)
Calcium: 9.2 mg/dL (ref 8.9–10.3)
Chloride: 102 mmol/L (ref 101–111)
Creatinine, Ser: 0.73 mg/dL (ref 0.44–1.00)
GFR calc Af Amer: >60 mL/min (ref >60)
Glucose, Bld: 76 mg/dL (ref 65–99)
Potassium: 4 mmol/L (ref 3.5–5.1)
Sodium: 140 mmol/L (ref 135–145)
Total Bilirubin: 0.5 mg/dL (ref 0.3–1.2)
Total Protein: 7.9 g/dL (ref 6.5–8.1)

## 2014-11-24 LAB — CBC WITH DIFFERENTIAL/PLATELET
Basophils Absolute: 0.1 10*3/uL (ref 0–0.1)
Basophils Relative: 1 %
EOS PCT: 1 %
Eosinophils Absolute: 0.1 10*3/uL (ref 0–0.7)
HEMATOCRIT: 35.2 % (ref 35.0–47.0)
Hemoglobin: 11 g/dL — ABNORMAL LOW (ref 12.0–16.0)
Lymphocytes Relative: 17 %
Lymphs Abs: 2.3 10*3/uL (ref 1.0–3.6)
MCH: 23.6 pg — ABNORMAL LOW (ref 26.0–34.0)
MCHC: 31.2 g/dL — AB (ref 32.0–36.0)
MCV: 75.4 fL — ABNORMAL LOW (ref 80.0–100.0)
MONOS PCT: 7 %
Monocytes Absolute: 1 10*3/uL — ABNORMAL HIGH (ref 0.2–0.9)
NEUTROS ABS: 10.4 10*3/uL — AB (ref 1.4–6.5)
Neutrophils Relative %: 74 %
Platelets: 546 10*3/uL — ABNORMAL HIGH (ref 150–440)
RBC: 4.67 MIL/uL (ref 3.80–5.20)
RDW: 17.3 % — ABNORMAL HIGH (ref 11.5–14.5)
WBC: 13.9 10*3/uL — ABNORMAL HIGH (ref 3.6–11.0)

## 2014-11-24 LAB — URINALYSIS COMPLETE WITH MICROSCOPIC (ARMC ONLY)
Bilirubin Urine: NEGATIVE
GLUCOSE, UA: NEGATIVE mg/dL
Leukocytes, UA: NEGATIVE
NITRITE: NEGATIVE
PH: 6 (ref 5.0–8.0)
Protein, ur: 30 mg/dL — AB
SPECIFIC GRAVITY, URINE: 1.025 (ref 1.005–1.030)

## 2014-11-24 LAB — POCT PREGNANCY, URINE: Preg Test, Ur: NEGATIVE

## 2014-11-24 LAB — LIPASE, BLOOD: Lipase: 29 U/L (ref 22–51)

## 2014-11-24 MED ORDER — METOCLOPRAMIDE HCL 10 MG PO TABS: 10.0000 mg | ORAL_TABLET | Freq: Four times a day (QID) | ORAL | Status: DC | PRN

## 2014-11-24 MED ORDER — SULFAMETHOXAZOLE-TRIMETHOPRIM 800-160 MG PO TABS
ORAL_TABLET | ORAL | Status: AC
  Administered 2014-11-24: 2 via ORAL
  Filled 2014-11-24: qty 2

## 2014-11-24 MED ORDER — HYDROMORPHONE HCL 1 MG/ML IJ SOLN
0.5000 mg | Freq: Once | INTRAMUSCULAR | Status: AC
Start: 2014-11-24 — End: 2014-11-24
  Administered 2014-11-24: 0.5 mg via INTRAMUSCULAR

## 2014-11-24 MED ORDER — HYDROMORPHONE HCL 1 MG/ML IJ SOLN
INTRAMUSCULAR | Status: AC
  Administered 2014-11-24: 0.5 mg via INTRAMUSCULAR
  Filled 2014-11-24: qty 1

## 2014-11-24 MED ORDER — SULFAMETHOXAZOLE-TRIMETHOPRIM 800-160 MG PO TABS
2.0000 | ORAL_TABLET | Freq: Once | ORAL | Status: AC
  Administered 2014-11-24: 2 via ORAL

## 2014-11-24 MED ORDER — ONDANSETRON 4 MG PO TBDP
4.0000 mg | ORAL_TABLET | Freq: Once | ORAL | Status: AC
  Administered 2014-11-24: 4 mg via ORAL

## 2014-11-24 MED ORDER — LIDOCAINE-EPINEPHRINE-TETRACAINE (LET) SOLUTION
NASAL | Status: AC
  Administered 2014-11-24: 3 mL via TOPICAL
  Filled 2014-11-24: qty 3

## 2014-11-24 MED ORDER — OXYCODONE-ACETAMINOPHEN 5-325 MG PO TABS
ORAL_TABLET | ORAL | Status: AC
  Administered 2014-11-24: 1 via ORAL
  Filled 2014-11-24: qty 1

## 2014-11-24 MED ORDER — ONDANSETRON 4 MG PO TBDP
ORAL_TABLET | ORAL | Status: AC
  Filled 2014-11-24: qty 1

## 2014-11-24 MED ORDER — LIDOCAINE-EPINEPHRINE (PF) 1 %-1:200000 IJ SOLN
INTRAMUSCULAR | Status: AC
  Filled 2014-11-24: qty 30

## 2014-11-24 MED ORDER — LIDOCAINE-EPINEPHRINE-TETRACAINE (LET) SOLUTION
3.0000 mL | Freq: Once | NASAL | Status: AC
  Administered 2014-11-24: 3 mL via TOPICAL

## 2014-11-24 MED ORDER — OXYCODONE-ACETAMINOPHEN 5-325 MG PO TABS
1.0000 | ORAL_TABLET | Freq: Once | ORAL | Status: AC
  Administered 2014-11-24: 1 via ORAL

## 2014-11-24 NOTE — ED Notes (Signed)
Was seen 2 days ago for abscess to buttocks  Developed n/v yesterday   Unable to keep any fluids down  Last time vomited about 1 hrs pta

## 2014-11-24 NOTE — Discharge Instructions (Signed)

## 2014-11-24 NOTE — ED Notes (Signed)
Pt states she was seen last week for a cysts on her sacrum, pt states she was given ABX and pain medicine, pt states she also had strep throat, pt states she has been unable to take her medicine and eat or drink for 1 week, pt states she has been out of work from the pain, pt awake and alert resting in bed in no distress upon assessment

## 2014-11-24 NOTE — ED Provider Notes (Signed)
[facility] Emergency Department Provider Note  ____________________________________________  Time seen: Approximately 6 PM  I have reviewed the triage vital signs and the nursing notes.   HISTORY  Chief Complaint Emesis    HPI Maria Tapia is a 29 y.o. female with a history of multiple pilonidal abscesses who presents today with 1 week of worsening sacral pain. Also now with nausea and vomiting. Says prescribed antibiotics in the emergency department previously but unable to take secondary to nausea and vomiting. Denies any fevers. Says has had several episodes of pain and swelling to her sacrum over the past 3 years that resolved spontaneously. Says she makes priority to keep the area clean and has last had to have it drained 3 years ago.  Past Medical History  Diagnosis Date  . Hypertension   . Scoliosis     There are no active problems to display for this patient.   Past Surgical History  Procedure Laterality Date  . C-cection     . Eye surgery      Current Outpatient Rx  Name  Route  Sig  Dispense  Refill  . azithromycin (ZITHROMAX Z-PAK) 250 MG tablet      Take 2 tablets (500 mg) on  Day 1,  followed by 1 tablet (250 mg) once daily on Days 2 through 5.   6 each   0   . benzonatate (TESSALON PERLES) 100 MG capsule   Oral   Take 1 capsule (100 mg total) by mouth 3 (three) times daily as needed for cough.   30 capsule   0   . HYDROcodone-acetaminophen (NORCO/VICODIN) 5-325 MG per tablet   Oral   Take 1 tablet by mouth every 4 (four) hours as needed for moderate pain.   20 tablet   0   . ibuprofen (ADVIL,MOTRIN) 800 MG tablet   Oral   Take 1 tablet (800 mg total) by mouth every 8 (eight) hours as needed.   30 tablet   0   . losartan (COZAAR) 25 MG tablet   Oral   Take 25 mg by mouth 2 (two) times daily.         Marland Kitchen. sulfamethoxazole-trimethoprim (BACTRIM DS,SEPTRA DS) 800-160 MG per tablet   Oral   Take 1 tablet by mouth 2  (two) times daily.   20 tablet   0     Allergies Amoxicillin and Penicillins  No family history on file.  Social History History  Substance Use Topics  . Smoking status: Current Some Day Smoker -- 1.00 packs/day    Types: Cigarettes  . Smokeless tobacco: Never Used  . Alcohol Use: No    Review of Systems Constitutional: No fever/chills Eyes: No visual changes. ENT: No sore throat. Cardiovascular: Denies chest pain. Respiratory: Denies shortness of breath. Gastrointestinal: See above  Genitourinary: Negative for dysuria. Musculoskeletal: Negative for back pain. Skin: See above  Neurological: Negative for headaches, focal weakness or numbness.  10-point ROS otherwise negative.  ____________________________________________   PHYSICAL EXAM:  VITAL SIGNS: ED Triage Vitals  Enc Vitals Group     BP 11/24/14 1419 142/110 mmHg     Pulse Rate 11/24/14 1419 86     Resp 11/24/14 1419 18     Temp 11/24/14 1419 98.1 F (36.7 C)     Temp Source 11/24/14 1419 Oral     SpO2 11/24/14 1419 98 %     Weight 11/24/14 1420 200 lb (90.719 kg)     Height 11/24/14 1420 4\' 11"  (  1.499 m)     Head Cir --      Peak Flow --      Pain Score 11/24/14 1420 9     Pain Loc --      Pain Edu? --      Excl. in GC? --    Constitutional: Alert and oriented. Well appearing and in no acute distress. Eyes: Conjunctivae are normal. PERRL. EOMI. Head: Atraumatic. Nose: No congestion/rhinnorhea. Mouth/Throat: Mucous membranes are moist.  Oropharynx non-erythematous. Neck: No stridor.   Cardiovascular: Normal rate, regular rhythm. Grossly normal heart sounds.  Good peripheral circulation. Respiratory: Normal respiratory effort.  No retractions. Lungs CTAB. Gastrointestinal: Soft and nontender. No distention. No abdominal bruits. No CVA tenderness. Musculoskeletal: No lower extremity tenderness nor edema.  No joint effusions. Neurologic:  Normal speech and language. No gross focal neurologic  deficits are appreciated. Speech is normal. No gait instability. Skin:  Just right of midline superiorly to the buttock there is an area of induration around scar tissue. There is a area of fluctuance medial on the scar tissue which is very tender to palpation. The dimensions of the indurated area about 6 x 4 cm. There is no drainage.   Psychiatric: Mood and affect are normal. Speech and behavior are normal.  ____________________________________________   LABS (all labs ordered are listed, but only abnormal results are displayed)  Labs Reviewed  CBC WITH DIFFERENTIAL/PLATELET - Abnormal; Notable for the following:    WBC 13.9 (*)    Hemoglobin 11.0 (*)    MCV 75.4 (*)    MCH 23.6 (*)    MCHC 31.2 (*)    RDW 17.3 (*)    Platelets 546 (*)    Neutro Abs 10.4 (*)    Monocytes Absolute 1.0 (*)    All other components within normal limits  COMPREHENSIVE METABOLIC PANEL - Abnormal; Notable for the following:    Albumin 3.4 (*)    ALT 10 (*)    All other components within normal limits  URINALYSIS COMPLETEWITH MICROSCOPIC ([facility] ONLY) - Abnormal; Notable for the following:    Color, Urine YELLOW (*)    APPearance CLOUDY (*)    Ketones, ur 2+ (*)    Hgb urine dipstick 1+ (*)    Protein, ur 30 (*)    Bacteria, UA RARE (*)    Squamous Epithelial / LPF 6-30 (*)    All other components within normal limits  LIPASE, BLOOD  POC URINE PREG, ED  POCT PREGNANCY, URINE   ____________________________________________  EKG   ____________________________________________  RADIOLOGY   ____________________________________________   PROCEDURES  INCISION AND DRAINAGE Performed by: Arelia Longest Consent: Verbal consent obtained. Risks and benefits: risks, benefits and alternatives were discussed Type: abscess  Body area: Right superior buttock just right of midline.  Anesthesia: local infiltration  Incision was made with a scalpel. A 1 cm vertical incision.  Local  anesthetic: lidocaine 1% with epinephrine  Anesthetic total: 4 ml  Complexity:Blunt dissection to break up loculations  Drainage: Bloody   Drainage amount: 2 cc Packing material: No packing  Patient tolerance: Patient tolerated the procedure well with no immediate complications.  No purulent drainage after incision and drainage. Likely changes secondary to cellulitis over scar tissue.     ____________________________________________   INITIAL IMPRESSION / ASSESSMENT AND PLAN / ED COURSE  Pertinent labs & imaging results that were available during my care of the patient were reviewed by me and considered in my medical decision making (see chart for  details).  ----------------------------------------- 7:33 PM on 11/24/2014 -----------------------------------------  Patient tolerated by mouth medications after Zofran. However, does not of insurance. Will discharge with Reglan. Patient also now asking for juice. No vomiting throughout the emergency department stay. Feel that the most important thing for this patient to do is take her antibiotics for resolution of her cellulitis. We'll discharge to home. We'll get follow-up with the [facility] clinic. Patient does not have a known PCP. Has Bactrim and Vicodin at home for pain control. ____________________________________________   FINAL CLINICAL IMPRESSION(S) / ED DIAGNOSES  Acute cellulitis to the buttocks. Return visit.    Myrna Blazer, MD 11/24/14 [id number]

## 2015-04-24 ENCOUNTER — Emergency Department
Admission: EM | Admit: 2015-04-24 | Discharge: 2015-04-24 | Disposition: A | Discharge status: Home / Self Care | Payer: Self-pay | Attending: Emergency Medicine | Admitting: Emergency Medicine

## 2015-04-24 ENCOUNTER — Encounter: Payer: Self-pay | Admitting: Emergency Medicine

## 2015-04-24 DIAGNOSIS — H0520 Unspecified exophthalmos: Secondary | ICD-10-CM | POA: Insufficient documentation

## 2015-04-24 DIAGNOSIS — M79662 Pain in left lower leg: Secondary | ICD-10-CM | POA: Insufficient documentation

## 2015-04-24 DIAGNOSIS — Z792 Long term (current) use of antibiotics: Secondary | ICD-10-CM | POA: Insufficient documentation

## 2015-04-24 DIAGNOSIS — Z720 Tobacco use: Secondary | ICD-10-CM | POA: Insufficient documentation

## 2015-04-24 DIAGNOSIS — Z3202 Encounter for pregnancy test, result negative: Secondary | ICD-10-CM | POA: Insufficient documentation

## 2015-04-24 DIAGNOSIS — M79661 Pain in right lower leg: Secondary | ICD-10-CM | POA: Insufficient documentation

## 2015-04-24 DIAGNOSIS — R109 Unspecified abdominal pain: Secondary | ICD-10-CM | POA: Insufficient documentation

## 2015-04-24 DIAGNOSIS — Z79899 Other long term (current) drug therapy: Secondary | ICD-10-CM | POA: Insufficient documentation

## 2015-04-24 DIAGNOSIS — I10 Essential (primary) hypertension: Secondary | ICD-10-CM | POA: Insufficient documentation

## 2015-04-24 DIAGNOSIS — H509 Unspecified strabismus: Secondary | ICD-10-CM | POA: Insufficient documentation

## 2015-04-24 DIAGNOSIS — Z880 Allergy status to penicillin: Secondary | ICD-10-CM | POA: Insufficient documentation

## 2015-04-24 DIAGNOSIS — M545 Low back pain: Secondary | ICD-10-CM | POA: Insufficient documentation

## 2015-04-24 DIAGNOSIS — E669 Obesity, unspecified: Secondary | ICD-10-CM | POA: Insufficient documentation

## 2015-04-24 DIAGNOSIS — R110 Nausea: Secondary | ICD-10-CM | POA: Insufficient documentation

## 2015-04-24 LAB — COMPREHENSIVE METABOLIC PANEL WITH GFR
ALT: 12 U/L — ABNORMAL LOW (ref 14–54)
AST: 17 U/L (ref 15–41)
Albumin: 3.7 g/dL (ref 3.5–5.0)
Alkaline Phosphatase: 59 U/L (ref 38–126)
Anion gap: 3 — ABNORMAL LOW (ref 5–15)
BUN: 10 mg/dL (ref 6–20)
CO2: 27 mmol/L (ref 22–32)
Calcium: 8.9 mg/dL (ref 8.9–10.3)
Chloride: 106 mmol/L (ref 101–111)
Creatinine, Ser: 0.55 mg/dL (ref 0.44–1.00)
GFR calc Af Amer: >60 mL/min (ref >60)
GFR calc non Af Amer: >60 mL/min (ref >60)
Glucose, Bld: 104 mg/dL — ABNORMAL HIGH (ref 65–99)
Potassium: 3.5 mmol/L (ref 3.5–5.1)
Sodium: 136 mmol/L (ref 135–145)
Total Bilirubin: 0.6 mg/dL (ref 0.3–1.2)
Total Protein: 7.1 g/dL (ref 6.5–8.1)

## 2015-04-24 LAB — URINALYSIS COMPLETE WITH MICROSCOPIC (ARMC ONLY)
Bacteria, UA: NONE SEEN
Bilirubin Urine: NEGATIVE
Glucose, UA: NEGATIVE mg/dL
Ketones, ur: NEGATIVE mg/dL
Leukocytes, UA: NEGATIVE
Nitrite: NEGATIVE
Protein, ur: NEGATIVE mg/dL
Specific Gravity, Urine: 1.017 (ref 1.005–1.030)
pH: 7 (ref 5.0–8.0)

## 2015-04-24 LAB — CBC
HCT: 33.3 % — ABNORMAL LOW (ref 35.0–47.0)
Hemoglobin: 10.5 g/dL — ABNORMAL LOW (ref 12.0–16.0)
MCH: 24.6 pg — ABNORMAL LOW (ref 26.0–34.0)
MCHC: 31.6 g/dL — ABNORMAL LOW (ref 32.0–36.0)
MCV: 77.9 fL — ABNORMAL LOW (ref 80.0–100.0)
Platelets: 375 K/uL (ref 150–440)
RBC: 4.27 MIL/uL (ref 3.80–5.20)
RDW: 18 % — ABNORMAL HIGH (ref 11.5–14.5)
WBC: 6.7 K/uL (ref 3.6–11.0)

## 2015-04-24 LAB — LIPASE, BLOOD: Lipase: 25 U/L (ref 11–51)

## 2015-04-24 LAB — PREGNANCY, URINE: Preg Test, Ur: NEGATIVE

## 2015-04-24 NOTE — ED Provider Notes (Signed)
[facility] Emergency Department Provider Note  ____________________________________________  Time seen: Approximately 3:59 PM  I have reviewed the triage vital signs and the nursing notes.   HISTORY  Chief Complaint Abdominal Pain    HPI Maria Tapia is a 29 y.o. female with a past medical history includes tobacco abuse, morbid obesity and hypertension who does not have a primary care doctor.  She presents with intermittent pain in her lower abdomen, back, and legs for 3 weeks.  She states that is not worse recently, she just has not been able to get a day off of work to go to the doctor.  She has occasional nausea but no vomiting.  She denies dysuria, vaginal bleeding, vaginal discharge, and pelvic pain.  She does state that her last menstrual period was about 2 weeks ago.  She denies fever/chills, chest pain, shortness of breath, upper abdominal pain.She describes the symptoms as gradual in onset, intermittent, mild to moderate in intensity, nothing makes them better or worse.  She recently went to the health department and had a pelvic exam which she states was normal.   Past Medical History  Diagnosis Date  . Hypertension   . Scoliosis     There are no active problems to display for this patient.   Past Surgical History  Procedure Laterality Date  . C-cection     . Eye surgery      Current Outpatient Rx  Name  Route  Sig  Dispense  Refill  . azithromycin (ZITHROMAX Z-PAK) 250 MG tablet      Take 2 tablets (500 mg) on  Day 1,  followed by 1 tablet (250 mg) once daily on Days 2 through 5.   6 each   0   . benzonatate (TESSALON PERLES) 100 MG capsule   Oral   Take 1 capsule (100 mg total) by mouth 3 (three) times daily as needed for cough.   30 capsule   0   . HYDROcodone-acetaminophen (NORCO/VICODIN) 5-325 MG per tablet   Oral   Take 1 tablet by mouth every 4 (four) hours as needed for moderate pain.   20 tablet   0   . ibuprofen  (ADVIL,MOTRIN) 800 MG tablet   Oral   Take 1 tablet (800 mg total) by mouth every 8 (eight) hours as needed.   30 tablet   0   . losartan (COZAAR) 25 MG tablet   Oral   Take 25 mg by mouth 2 (two) times daily.         . metoCLOPramide (REGLAN) 10 MG tablet   Oral   Take 1 tablet (10 mg total) by mouth every 6 (six) hours as needed for nausea or vomiting.   12 tablet   1   . sulfamethoxazole-trimethoprim (BACTRIM DS,SEPTRA DS) 800-160 MG per tablet   Oral   Take 1 tablet by mouth 2 (two) times daily.   20 tablet   0     Allergies Amoxicillin and Penicillins  No family history on file.  Social History Social History  Substance Use Topics  . Smoking status: Current Some Day Smoker -- 1.00 packs/day    Types: Cigarettes  . Smokeless tobacco: Never Used  . Alcohol Use: No    Review of Systems Constitutional: No fever/chills Eyes: No visual changes. ENT: No sore throat. Cardiovascular: Denies chest pain. Respiratory: Denies shortness of breath. Gastrointestinal: Intermittent mild to moderate abdominal pain.  nausea, no vomiting.  No diarrhea.  No constipation. Genitourinary: Negative  for dysuria. Musculoskeletal: Negative for back pain. Skin: Negative for rash. Neurological: Negative for headaches, focal weakness or numbness.  10-point ROS otherwise negative.  ____________________________________________   PHYSICAL EXAM:  VITAL SIGNS: ED Triage Vitals  Enc Vitals Group     BP 04/24/15 1309 118/112 mmHg     Pulse Rate 04/24/15 1309 102     Resp 04/24/15 1309 18     Temp 04/24/15 1309 98.2 F (36.8 C)     Temp Source 04/24/15 1309 Oral     SpO2 04/24/15 1309 99 %     Weight 04/24/15 1309 203 lb (92.08 kg)     Height 04/24/15 1309  (1.499 m)     Head Cir --      Peak Flow --      Pain Score 04/24/15 1332 10     Pain Loc --      Pain Edu? --      Excl. in GC? --     Constitutional: Alert and oriented. Well appearing and in no acute  distress. Eyes: Conjunctivae are normal. PERRL. EOMI. 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 Nose: No congestion/rhinnorhea. Mouth/Throat: Mucous membranes are moist.  Oropharynx non-erythematous. Neck: No stridor.   Cardiovascular: Normal rate, regular rhythm. Grossly normal heart sounds.  Good peripheral circulation. Respiratory: Normal respiratory effort.  No retractions. Lungs CTAB. Gastrointestinal: Obese.  Soft and nontender. No distention.  No CVA tenderness. Genitourinary: Offered but deferred at patient's request Musculoskeletal: No lower extremity tenderness nor edema.  No joint effusions. Neurologic:  Normal speech and language. No gross focal neurologic deficits are appreciated.  Skin:  Skin is warm, dry and intact. No rash noted. Psychiatric: Mood and affect are normal. Speech and behavior are normal.  ____________________________________________   LABS (all labs ordered are listed, but only abnormal results are displayed)  Labs Reviewed  COMPREHENSIVE METABOLIC PANEL - Abnormal; Notable for the following:    Glucose, Bld 104 (*)    ALT 12 (*)    Anion gap 3 (*)    All other components within normal limits  CBC - Abnormal; Notable for the following:    Hemoglobin 10.5 (*)    HCT 33.3 (*)    MCV 77.9 (*)    MCH 24.6 (*)    MCHC 31.6 (*)    RDW 18.0 (*)    All other components within normal limits  URINALYSIS COMPLETEWITH MICROSCOPIC ([facility] ONLY) - Abnormal; Notable for the following:    Color, Urine YELLOW (*)    APPearance CLEAR (*)    Hgb urine dipstick 1+ (*)    Squamous Epithelial / LPF 6-30 (*)    All other components within normal limits  LIPASE, BLOOD  PREGNANCY, URINE   ____________________________________________  EKG  Not indicated ____________________________________________  RADIOLOGY   No results found.  ____________________________________________   PROCEDURES  Procedure(s) performed: None  Critical  Care performed: No ____________________________________________   INITIAL IMPRESSION / ASSESSMENT AND PLAN / ED COURSE  Pertinent labs & imaging results that were available during my care of the patient were reviewed by me and considered in my medical decision making (see chart for details).  The patient is well-appearing and in no acute distress.  She was initially slightly tachycardic but that was after she walked into triage.  Her initial blood pressure of 118/112 is inaccurate; repeated blood pressures in the room were all within normal limits.  She has no abdominal tenderness to palpation and her lab tests are  all unremarkable.  I explained to her that I cannot tell her exactly what she is feeling the symptoms she describes but that there does not appear to be an emergent cause of her symptoms at this time.  [facility].  She understands and agrees with the plan and states she is ready to go home.  ____________________________________________  FINAL CLINICAL IMPRESSION(S) / ED DIAGNOSES  Final diagnoses:  Abdominal pain, unspecified abdominal location      NEW MEDICATIONS STARTED DURING THIS VISIT:  New Prescriptions   No medications on file     Loleta Rose, MD 04/24/15 1631

## 2015-04-24 NOTE — Discharge Instructions (Signed)
As we discussed, your workup today was reassuring.  Though we do not know exactly what is causing your symptoms, it appears that you have no emergent medical condition at this time are safe to go home and follow up as recommended in this paperwork.  Please return immediately to the Emergency Department if you develop any new or worsening symptoms that concern you.   Abdominal Pain, Adult Many things can cause belly (abdominal) pain. Most times, the belly pain is not dangerous. Many cases of belly pain can be watched and treated at home. HOME CARE   Do not take medicines that help you go poop (laxatives) unless told to by your doctor.  Only take medicine as told by your doctor.  Eat or drink as told by your doctor. Your doctor will tell you if you should be on a special diet. GET HELP IF:  You do not know what is causing your belly pain.  You have belly pain while you are sick to your stomach (nauseous) or have runny poop (diarrhea).  You have pain while you pee or poop.  Your belly pain wakes you up at night.  You have belly pain that gets worse or better when you eat.  You have belly pain that gets worse when you eat fatty foods.  You have a fever. GET HELP RIGHT AWAY IF:   The pain does not go away within 2 hours.  You keep throwing up (vomiting).  The pain changes and is only in the right or left part of the belly.  You have bloody or tarry looking poop. MAKE SURE YOU:   Understand these instructions.  Will watch your condition.  Will get help right away if you are not doing well or get worse.   This information is not intended to replace advice given to you by your health care provider. Make sure you discuss any questions you have with your health care provider.   Document Released: 11/28/2007 Document Revised: 07/02/2014 Document Reviewed: 02/18/2013 Elsevier Interactive Patient Education [employer]2016 Elsevier Inc.

## 2015-04-24 NOTE — ED Notes (Signed)
Pt lower abd pain and lower back pain. She also reports headache and nausea. States is has been going on for 3 months. Denies fever. No burning with urination but states it has an order. abd a

## 2015-04-24 NOTE — ED Notes (Signed)
Pt states she has been having pain in lower abdomen and back and legs for 3 weeks and states she hasn't been able to be seen sooner due to work and today she had a day off.

## 2015-08-10 ENCOUNTER — Encounter: Payer: Self-pay | Admitting: Emergency Medicine

## 2015-08-10 ENCOUNTER — Emergency Department
Admission: EM | Admit: 2015-08-10 | Discharge: 2015-08-10 | Disposition: A | Discharge status: Home / Self Care | Payer: Self-pay | Attending: Emergency Medicine | Admitting: Emergency Medicine

## 2015-08-10 ENCOUNTER — Emergency Department: Payer: Self-pay

## 2015-08-10 DIAGNOSIS — F331 Major depressive disorder, recurrent, moderate: Secondary | ICD-10-CM

## 2015-08-10 DIAGNOSIS — Z3202 Encounter for pregnancy test, result negative: Secondary | ICD-10-CM | POA: Insufficient documentation

## 2015-08-10 DIAGNOSIS — Z79899 Other long term (current) drug therapy: Secondary | ICD-10-CM | POA: Insufficient documentation

## 2015-08-10 DIAGNOSIS — Z880 Allergy status to penicillin: Secondary | ICD-10-CM | POA: Insufficient documentation

## 2015-08-10 DIAGNOSIS — I10 Essential (primary) hypertension: Secondary | ICD-10-CM | POA: Insufficient documentation

## 2015-08-10 DIAGNOSIS — R45851 Suicidal ideations: Secondary | ICD-10-CM

## 2015-08-10 DIAGNOSIS — Z792 Long term (current) use of antibiotics: Secondary | ICD-10-CM | POA: Insufficient documentation

## 2015-08-10 DIAGNOSIS — F17210 Nicotine dependence, cigarettes, uncomplicated: Secondary | ICD-10-CM | POA: Insufficient documentation

## 2015-08-10 LAB — URINALYSIS COMPLETE WITH MICROSCOPIC (ARMC ONLY)
BILIRUBIN URINE: NEGATIVE
Glucose, UA: NEGATIVE mg/dL
Ketones, ur: NEGATIVE mg/dL
LEUKOCYTES UA: NEGATIVE
Nitrite: NEGATIVE
PH: 6 (ref 5.0–8.0)
Protein, ur: NEGATIVE mg/dL
Specific Gravity, Urine: 1.018 (ref 1.005–1.030)
WBC, UA: NONE SEEN WBC/hpf (ref 0–5)

## 2015-08-10 LAB — CBC
HCT: 38.6 % (ref 35.0–47.0)
HEMOGLOBIN: 12.6 g/dL (ref 12.0–16.0)
MCH: 25.2 pg — AB (ref 26.0–34.0)
MCHC: 32.6 g/dL (ref 32.0–36.0)
MCV: 77.3 fL — ABNORMAL LOW (ref 80.0–100.0)
Platelets: 466 10*3/uL — ABNORMAL HIGH (ref 150–440)
RBC: 4.99 MIL/uL (ref 3.80–5.20)
RDW: 17 % — AB (ref 11.5–14.5)
WBC: 9.5 10*3/uL (ref 3.6–11.0)

## 2015-08-10 LAB — COMPREHENSIVE METABOLIC PANEL
ALK PHOS: 68 U/L (ref 38–126)
ALT: 15 U/L (ref 14–54)
ANION GAP: 9 (ref 5–15)
AST: 18 U/L (ref 15–41)
Albumin: 4.2 g/dL (ref 3.5–5.0)
BILIRUBIN TOTAL: 0.5 mg/dL (ref 0.3–1.2)
BUN: 12 mg/dL (ref 6–20)
CALCIUM: 9.2 mg/dL (ref 8.9–10.3)
CO2: 26 mmol/L (ref 22–32)
CREATININE: 0.58 mg/dL (ref 0.44–1.00)
Chloride: 103 mmol/L (ref 101–111)
GFR calc Af Amer: >60 mL/min (ref >60)
GFR calc non Af Amer: >60 mL/min (ref >60)
GLUCOSE: 87 mg/dL (ref 65–99)
Potassium: 3.4 mmol/L — ABNORMAL LOW (ref 3.5–5.1)
Sodium: 138 mmol/L (ref 135–145)
TOTAL PROTEIN: 8.6 g/dL — AB (ref 6.5–8.1)

## 2015-08-10 LAB — POCT PREGNANCY, URINE: Preg Test, Ur: NEGATIVE

## 2015-08-10 LAB — PREGNANCY, URINE: PREG TEST UR: NEGATIVE

## 2015-08-10 LAB — ACETAMINOPHEN LEVEL

## 2015-08-10 LAB — TROPONIN I: Troponin I: 0.03 ng/mL (ref <0.031)

## 2015-08-10 LAB — TSH: TSH: 0.756 u[IU]/mL (ref 0.350–4.500)

## 2015-08-10 LAB — SALICYLATE LEVEL: Salicylate Lvl: <4 mg/dL (ref 2.8–30.0)

## 2015-08-10 LAB — ETHANOL: Alcohol, Ethyl (B): <5 mg/dL (ref <5)

## 2015-08-10 MED ORDER — CITALOPRAM HYDROBROMIDE 20 MG PO TABS: 20.0000 mg | ORAL_TABLET | Freq: Every day | ORAL | Status: DC

## 2015-08-10 MED ORDER — KETOROLAC TROMETHAMINE 30 MG/ML IJ SOLN
30.0000 mg | Freq: Once | INTRAMUSCULAR | Status: AC
  Administered 2015-08-10: 30 mg via INTRAVENOUS
  Filled 2015-08-10: qty 1

## 2015-08-10 MED ORDER — LOSARTAN POTASSIUM 25 MG PO TABS
100.0000 mg | ORAL_TABLET | Freq: Once | ORAL | Status: AC
  Administered 2015-08-10: 100 mg via ORAL
  Filled 2015-08-10: qty 4

## 2015-08-10 MED ORDER — ACETAMINOPHEN 500 MG PO TABS
1000.0000 mg | ORAL_TABLET | Freq: Once | ORAL | Status: AC
Start: 2015-08-10 — End: 2015-08-10
  Administered 2015-08-10: 1000 mg via ORAL
  Filled 2015-08-10: qty 2

## 2015-08-10 NOTE — Consult Note (Signed)
[place] [facility] Consult   Reason for Consult:  Consult note for 30 year old woman voluntarily presented to the emergency room with both medical and psychiatric planes. Concern raised about possible suicidal ideation and depression Referring Physician:  [place] Patient Identification: Maria Tapia MRN:  [id number] Principal Diagnosis: Depression, major, recurrent, moderate ([place]) Diagnosis:   Patient Active Problem List   Diagnosis Date Noted  . Suicidal ideation [R45.851] 08/10/2015  . Depression, major, recurrent, moderate (Maria Tapia) [F33.1] 08/10/2015    Total Time spent with patient: 1 hour  Subjective:   Maria Tapia is a 30 y.o. female patient admitted with "today was just a really bad day".  HPI:  Patient interviewed. Her mother was also present and confirmed and contributed some information. Labs reviewed. Case discussed with emergency room physician and TTS. This 30 year old woman came to the emergency room both for a headache and some possible chest pain and because her mood has been worse. Psychiatrically she reports that today in particular she was feeling very bad. Lots of crying spells. Feeling overwhelmed. Feeling irritable and sad at the same time. Feeling like she is going to lose her temper over minor issues. Patient says all of these symptoms of been present for years and they come and go to some extent although it's been worse recently. The last couple weeks in particular of been bad. She also reports that her symptoms typically get much worse before her menstrual period starts. Her sleep habits seem to be chaotic as she has multiple different jobs. She is recently feeling fatigued and also complaining of a headache which may be in part due to her out-of-control blood pressure as well. Patient denies that she's drinking alcohol denies the use of any drugs. She is not currently getting any kind of mental health treatment. She admits that she had some thoughts about  killing herself earlier but didn't actually have any intention or plan to act on it. Has not made suicide attempts in the past. Has positive things in her life that she is living for. Denies any psychotic symptoms.  Social history: Patient is living by herself but seems to be surrounded by supportive extended family. She says she's never really by herself. Sounds like she works more than one job and stays very busy. She has no current health insurance but her mother is wanting to try and get her signed up for some period .there is a emotional issue at play in involving the patient's daughter who is 4 or 39 years old. Evidently the daughter had to be separated from the patient when she was an infant for reasons that we did not go into the details of. The daughter lives with the grandmother (patient's mother) and there is a little bit of emotional strain around it.  Medical history: She knows she has high blood pressure but has not been on medicine in years because of lack of funds and ignoring the problem. Patient doesn't know of any other medical problems. I would wonder about her thyroid gland having a look at the way that her eyeballs are extruding right now. She is not on any prescription medicine.  Substance abuse history: Smokes a pack a day but denies use of alcohol or any other drugs of abuse.  Past Psychiatric History: Patient states that she was diagnosed with depression after her daughter was born about 9 years ago but refused to take any medicine. Never been in a psychiatric hospital. No history of suicide attempts. No history of any mental  health medication. She says she briefly saw a therapist didn't seem to make much many impact on her.  Risk to Self: Is patient at risk for suicide?: Yes Risk to Others:   Prior Inpatient Therapy:   Prior Outpatient Therapy:    Past Medical History:  Past Medical History  Diagnosis Date  . Hypertension   . Scoliosis     Past Surgical History   Procedure Laterality Date  . C-cection     . Eye surgery     Family History: No family history on file. Family Psychiatric  History: Patient does not know of any family history of mental health problems Social History:  History  Alcohol Use No     History  Drug Use No    Social History   Social History  . Marital Status: Single    Spouse Name: N/A  . Number of Children: N/A  . Years of Education: N/A   Social History Main Topics  . Smoking status: Current Some Day Smoker -- 1.00 packs/day    Types: Cigarettes  . Smokeless tobacco: Never Used  . Alcohol Use: No  . Drug Use: No  . Sexual Activity: Yes    Birth Control/ Protection: None   Other Topics Concern  . None   Social History Narrative   Additional Social History:    Allergies:   Allergies  Allergen Reactions  . Amoxicillin Hives  . Penicillins Hives    Labs:  Results for orders placed or performed during the hospital encounter of 08/10/15 (from the past 48 hour(s))  Comprehensive metabolic panel     Status: Abnormal   Collection Time: 08/10/15  4:48 PM  Result Value Ref Range   Sodium 138 135 - 145 mmol/L   Potassium 3.4 (L) 3.5 - 5.1 mmol/L   Chloride 103 101 - 111 mmol/L   CO2 26 22 - 32 mmol/L   Glucose, Bld 87 65 - 99 mg/dL   BUN 12 6 - 20 mg/dL   Creatinine, Ser 0.58 0.44 - 1.00 mg/dL   Calcium 9.2 8.9 - 10.3 mg/dL   Total Protein 8.6 (H) 6.5 - 8.1 g/dL   Albumin 4.2 3.5 - 5.0 g/dL   AST 18 15 - 41 U/L   ALT 15 14 - 54 U/L   Alkaline Phosphatase 68 38 - 126 U/L   Total Bilirubin 0.5 0.3 - 1.2 mg/dL   GFR calc non Af Amer >60 >60 mL/min   GFR calc Af Amer >60 >60 mL/min    Comment: (NOTE) The eGFR has been calculated using the CKD EPI equation. This calculation has not been validated in all clinical situations. eGFR's persistently <60 mL/min signify possible Chronic Kidney Disease.    Anion gap 9 5 - 15  Ethanol (ETOH)     Status: None   Collection Time: 08/10/15  4:48 PM  Result  Value Ref Range   Alcohol, Ethyl (B) <5 <5 mg/dL    Comment:        LOWEST DETECTABLE LIMIT FOR SERUM ALCOHOL IS 5 mg/dL FOR MEDICAL PURPOSES ONLY   Salicylate level     Status: None   Collection Time: 08/10/15  4:48 PM  Result Value Ref Range   Salicylate Lvl <6.2 2.8 - 30.0 mg/dL  Acetaminophen level     Status: Abnormal   Collection Time: 08/10/15  4:48 PM  Result Value Ref Range   Acetaminophen (Tylenol), Serum <10 (L) 10 - 30 ug/mL    Comment:  THERAPEUTIC CONCENTRATIONS VARY SIGNIFICANTLY. A RANGE OF 10-30 ug/mL MAY BE AN EFFECTIVE CONCENTRATION FOR MANY PATIENTS. HOWEVER, SOME ARE BEST TREATED AT CONCENTRATIONS OUTSIDE THIS RANGE. ACETAMINOPHEN CONCENTRATIONS >150 ug/mL AT 4 HOURS AFTER INGESTION AND >50 ug/mL AT 12 HOURS AFTER INGESTION ARE OFTEN ASSOCIATED WITH TOXIC REACTIONS.   CBC     Status: Abnormal   Collection Time: 08/10/15  4:48 PM  Result Value Ref Range   WBC 9.5 3.6 - 11.0 K/uL   RBC 4.99 3.80 - 5.20 MIL/uL   Hemoglobin 12.6 12.0 - 16.0 g/dL   HCT 38.6 35.0 - 47.0 %   MCV 77.3 (L) 80.0 - 100.0 fL   MCH 25.2 (L) 26.0 - 34.0 pg   MCHC 32.6 32.0 - 36.0 g/dL   RDW 17.0 (H) 11.5 - 14.5 %   Platelets 466 (H) 150 - 440 K/uL  Troponin I     Status: None   Collection Time: 08/10/15  4:48 PM  Result Value Ref Range   Troponin I 0.03 <0.031 ng/mL    Comment:        NO INDICATION OF MYOCARDIAL INJURY.     No current facility-administered medications for this encounter.   Current Outpatient Prescriptions  Medication Sig Dispense Refill  . azithromycin (ZITHROMAX Z-PAK) 250 MG tablet Take 2 tablets (500 mg) on  Day 1,  followed by 1 tablet (250 mg) once daily on Days 2 through 5. 6 each 0  . benzonatate (TESSALON PERLES) 100 MG capsule Take 1 capsule (100 mg total) by mouth 3 (three) times daily as needed for cough. 30 capsule 0  . citalopram (CELEXA) 20 MG tablet Take 1 tablet (20 mg total) by mouth daily. 30 tablet 0  .  HYDROcodone-acetaminophen (NORCO/VICODIN) 5-325 MG per tablet Take 1 tablet by mouth every 4 (four) hours as needed for moderate pain. 20 tablet 0  . ibuprofen (ADVIL,MOTRIN) 800 MG tablet Take 1 tablet (800 mg total) by mouth every 8 (eight) hours as needed. 30 tablet 0  . losartan (COZAAR) 25 MG tablet Take 25 mg by mouth 2 (two) times daily.    . metoCLOPramide (REGLAN) 10 MG tablet Take 1 tablet (10 mg total) by mouth every 6 (six) hours as needed for nausea or vomiting. 12 tablet 1  . sulfamethoxazole-trimethoprim (BACTRIM DS,SEPTRA DS) 800-160 MG per tablet Take 1 tablet by mouth 2 (two) times daily. 20 tablet 0    Musculoskeletal: Strength & Muscle Tone: within normal limits Gait & Station: normal Patient leans: N/A  Psychiatric Specialty Exam: Review of Systems  Constitutional: Positive for malaise/fatigue.  HENT: Negative.   Eyes: Negative.   Respiratory: Negative.   Cardiovascular: Positive for chest pain.  Gastrointestinal: Negative.   Musculoskeletal: Negative.   Skin: Negative.   Neurological: Negative.   Psychiatric/Behavioral: Positive for depression and suicidal ideas. Negative for hallucinations, memory loss and substance abuse. The patient is nervous/anxious and has insomnia.     Blood pressure 190/121, pulse 86, temperature 98.3 F (36.8 C), temperature source Oral, resp. rate 23, height _0  (1.499 m), weight 91.627 kg (202 lb), last menstrual period 07/20/2015, SpO2 99 %.Body mass index is 40.78 kg/(m^2).  General Appearance: Casual  Eye Contact::  Fair  Speech:  Slow  Volume:  Decreased  Mood:  Dysphoric  Affect:  Depressed  Thought Process:  Goal Directed  Orientation:  Full (Time, Place, and Person)  Thought Content:  Negative  Suicidal Thoughts:  Yes.  without intent/plan  Homicidal Thoughts:  No  Memory:  Immediate;   Good Recent;   Fair Remote;   Poor  Judgement:  Fair  Insight:  Fair  Psychomotor Activity:  Decreased  Concentration:  Fair   Recall:  [employer] of Knowledge:Fair  Language: Fair  Akathisia:  No  Handed:  Right  AIMS (if indicated):     Assets:  Communication Skills Desire for Improvement Housing Resilience Social Support  ADL's:  Intact  Cognition: WNL  Sleep:      Treatment Plan Summary: Medication management and Plan This a 30 year old woman who has symptoms consistent with major depression. No evidence of psychosis. She had passive suicidal thoughts earlier but did not act on it and is not currently entertaining any plan or intention and has no past history of suicide attempts. Patient is showing evidence of being cooperative with treatment planning. I do not think she meets commitment criteria and do not think she necessarily needs inpatient psychiatric treatment. I educated the patient about her diagnosis of Maj. depression and the importance of getting treatment for it. Case reviewed with emergency room physician. I have asked TTS to speak with her and give her referrals for local mental health care. I proposed to the patient that we start citalopram 10 mg a day for a week followed by 20 mg a day as an initial treatment for depression. Patient educated about the need to stay on antidepressants. She is agreeable to the plan. Prescription is on the chart. No commitment needed. Patient's suicidal ideation appears to be under control and not in acute danger that requires hospital level treatment. Family appears to be appropriately concerned and aware of the need for further treatment as well.  Disposition: Patient does not meet criteria for psychiatric inpatient admission. Supportive therapy provided about ongoing stressors. Discussed crisis plan, support from social network, calling 911, coming to the Emergency Department, and calling Suicide Hotline.  Alethia Berthold, MD 08/10/2015 6:12 PM

## 2015-08-10 NOTE — Discharge Instructions (Signed)
Return to the ER for thoughts of wanting to hurt yourself, headache unlike any you've had before especially if vomiting, vision changes, or numbness/weakness, chest pain, or for any other concerns.    Major Depressive Disorder Major depressive disorder is a mental illness. It also may be called clinical depression or unipolar depression. Major depressive disorder usually causes feelings of sadness, hopelessness, or helplessness. Some people with this disorder do not feel particularly sad but lose interest in doing things they used to enjoy (anhedonia). Major depressive disorder also can cause physical symptoms. It can interfere with work, school, relationships, and other normal everyday activities. The disorder varies in severity but is longer lasting and more serious than the sadness we all feel from time to time in our lives. Major depressive disorder often is triggered by stressful life events or major life changes. Examples of these triggers include divorce, loss of your job or home, a move, and the death of a family member or close friend. Sometimes this disorder occurs for no obvious reason at all. People who have family members with major depressive disorder or bipolar disorder are at higher risk for developing this disorder, with or without life stressors. Major depressive disorder can occur at any age. It may occur just once in your life (single episode major depressive disorder). It may occur multiple times (recurrent major depressive disorder). SYMPTOMS People with major depressive disorder have either anhedonia or depressed mood on nearly a daily basis for at least 2 weeks or longer. Symptoms of depressed mood include:  Feelings of sadness (blue or down in the dumps) or emptiness.  Feelings of hopelessness or helplessness.  Tearfulness or episodes of crying (may be observed by others).  Irritability (children and adolescents). In addition to depressed mood or anhedonia or both, people with  this disorder have at least four of the following symptoms:  Difficulty sleeping or sleeping too much.   Significant change (increase or decrease) in appetite or weight.   Lack of energy or motivation.  Feelings of guilt and worthlessness.   Difficulty concentrating, remembering, or making decisions.  Unusually slow movement (psychomotor retardation) or restlessness (as observed by others).   Recurrent wishes for death, recurrent thoughts of self-harm (suicide), or a suicide attempt. People with major depressive disorder commonly have persistent negative thoughts about themselves, other people, and the world. People with severe major depressive disorder may experiencedistorted beliefs or perceptions about the world (psychotic delusions). They also may see or hear things that are not real (psychotic hallucinations). DIAGNOSIS Major depressive disorder is diagnosed through an assessment by your health care provider. Your health care provider will ask aboutaspects of your daily life, such as mood,sleep, and appetite, to see if you have the diagnostic symptoms of major depressive disorder. Your health care provider may ask about your medical history and use of alcohol or drugs, including prescription medicines. Your health care provider also may do a physical exam and blood work. This is because certain medical conditions and the use of certain substances can cause major depressive disorder-like symptoms (secondary depression). Your health care provider also may refer you to a mental health specialist for further evaluation and treatment. TREATMENT It is important to recognize the symptoms of major depressive disorder and seek treatment. The following treatments can be prescribed for this disorder:   Medicine. Antidepressant medicines usually are prescribed. Antidepressant medicines are thought to correct chemical imbalances in the brain that are commonly associated with major depressive  disorder. Other types of medicine may  be added if the symptoms do not respond to antidepressant medicines alone or if psychotic delusions or hallucinations occur.  Talk therapy. Talk therapy can be helpful in treating major depressive disorder by providing support, education, and guidance. Certain types of talk therapy also can help with negative thinking (cognitive behavioral therapy) and with relationship issues that trigger this disorder (interpersonal therapy). A mental health specialist can help determine which treatment is best for you. Most people with major depressive disorder do well with a combination of medicine and talk therapy. Treatments involving electrical stimulation of the brain can be used in situations with extremely severe symptoms or when medicine and talk therapy do not work over time. These treatments include electroconvulsive therapy, transcranial magnetic stimulation, and vagal nerve stimulation.   This information is not intended to replace advice given to you by your health care provider. Make sure you discuss any questions you have with your health care provider.   Document Released: 10/06/2012 Document Revised: 07/02/2014 Document Reviewed: 10/06/2012 Elsevier Interactive Patient Education 2016 [employer].  Hypertension Hypertension, commonly called high blood pressure, is when the force of blood pumping through your arteries is too strong. Your arteries are the blood vessels that carry blood from your heart throughout your body. A blood pressure reading consists of a higher number over a lower number, such as 110/72. The higher number (systolic) is the pressure inside your arteries when your heart pumps. The lower number (diastolic) is the pressure inside your arteries when your heart relaxes. Ideally you want your blood pressure below 120/80. Hypertension forces your heart to work harder to pump blood. Your arteries may become narrow or stiff. Having untreated or  uncontrolled hypertension can cause heart attack, stroke, kidney disease, and other problems. RISK FACTORS Some risk factors for high blood pressure are controllable. Others are not.  Risk factors you cannot control include:   Race. You may be at higher risk if you are African American.  Age. Risk increases with age.  Gender. Men are at higher risk than women before age 54 years. After age 33, women are at higher risk than men. Risk factors you can control include:  Not getting enough exercise or physical activity.  Being overweight.  Getting too much fat, sugar, calories, or salt in your diet.  Drinking too much alcohol. SIGNS AND SYMPTOMS Hypertension does not usually cause signs or symptoms. Extremely high blood pressure (hypertensive crisis) may cause headache, anxiety, shortness of breath, and nosebleed. DIAGNOSIS To check if you have hypertension, your health care provider will measure your blood pressure while you are seated, with your arm held at the level of your heart. It should be measured at least twice using the same arm. Certain conditions can cause a difference in blood pressure between your right and left arms. A blood pressure reading that is higher than normal on one occasion does not mean that you need treatment. If it is not clear whether you have high blood pressure, you may be asked to return on a different day to have your blood pressure checked again. Or, you may be asked to monitor your blood pressure at home for 1 or more weeks. TREATMENT Treating high blood pressure includes making lifestyle changes and possibly taking medicine. Living a healthy lifestyle can help lower high blood pressure. You may need to change some of your habits. Lifestyle changes may include:  Following the DASH diet. This diet is high in fruits, vegetables, and whole grains. It is low in salt,  red meat, and added sugars.  Keep your sodium intake below 2,300 mg per day.  Getting at least  30-45 minutes of aerobic exercise at least 4 times per week.  Losing weight if necessary.  Not smoking.  Limiting alcoholic beverages.  Learning ways to reduce stress. Your health care provider may prescribe medicine if lifestyle changes are not enough to get your blood pressure under control, and if one of the following is true:  You are 59-57 years of age and your systolic blood pressure is above 140.  You are 26 years of age or older, and your systolic blood pressure is above 150.  Your diastolic blood pressure is above 90.  You have diabetes, and your systolic blood pressure is over 140 or your diastolic blood pressure is over 90.  You have kidney disease and your blood pressure is above 140/90.  You have heart disease and your blood pressure is above 140/90. Your personal target blood pressure may vary depending on your medical conditions, your age, and other factors. HOME CARE INSTRUCTIONS  Have your blood pressure rechecked as directed by your health care provider.   Take medicines only as directed by your health care provider. Follow the directions carefully. Blood pressure medicines must be taken as prescribed. The medicine does not work as well when you skip doses. Skipping doses also puts you at risk for problems.  Do not smoke.   Monitor your blood pressure at home as directed by your health care provider. SEEK MEDICAL CARE IF:   You think you are having a reaction to medicines taken.  You have recurrent headaches or feel dizzy.  You have swelling in your ankles.  You have trouble with your vision. SEEK IMMEDIATE MEDICAL CARE IF:  You develop a severe headache or confusion.  You have unusual weakness, numbness, or feel faint.  You have severe chest or abdominal pain.  You vomit repeatedly.  You have trouble breathing. MAKE SURE YOU:   Understand these instructions.  Will watch your condition.  Will get help right away if you are not doing well  or get worse.   This information is not intended to replace advice given to you by your health care provider. Make sure you discuss any questions you have with your health care provider.   Document Released: 06/11/2005 Document Revised: 10/26/2014 Document Reviewed: 04/03/2013 Elsevier Interactive Patient Education [employer]

## 2015-08-10 NOTE — ED Notes (Signed)
Psych MD at bedside

## 2015-08-10 NOTE — ED Notes (Signed)
Lab called and informed of TSH add on.

## 2015-08-10 NOTE — ED Provider Notes (Signed)
[facility] Emergency Department Provider Note ____________________________________________  Time seen: Approximately 5:28 PM  I have reviewed the triage vital signs and the nursing notes.   HISTORY  Chief Complaint Suicidal and Chest Pain    HPI Maria Tapia is a 30 y.o. 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 She states there was a "accident with her daughter" and she was in jail for a while. She states she has frequent crying spells because her "hormones are bad". She lives alone. 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 Her mother lives nearby and is a good support for her. She did not have a plan for suicide but states her mind was "really cloudy."  He has not been seen by a psychiatrist since she was in jail and was offered medications at that time but he did not want to take any.  She has not had a primary care provider in a while and has been feeling like her blood pressure has been elevated recently. Therefore she took 2 old losartan tablets yesterday morning. She has a headache that is typical of her previous headaches with hypertension. He denies any numbness. She does have some mild bilateral foot tingling. Her vision is mildly blurry. 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 She also had some chest pain 3 hours ago.    She has been having some nausea but denies any vomiting.    Past Medical History  Diagnosis Date  . Hypertension   . Scoliosis     Patient Active Problem List   Diagnosis Date Noted  . Suicidal ideation 08/10/2015  . Depression, major, recurrent, moderate (HCC) 08/10/2015    Past Surgical History  Procedure Laterality Date  . C-cection     . Eye surgery      Current Outpatient Rx  Name  Route  Sig  Dispense  Refill  . azithromycin (ZITHROMAX Z-PAK) 250 MG tablet      Take 2 tablets (500 mg) on  Day  1,  followed by 1 tablet (250 mg) once daily on Days 2 through 5.   6 each   0   . benzonatate (TESSALON PERLES) 100 MG capsule   Oral   Take 1 capsule (100 mg total) by mouth 3 (three) times daily as needed for cough.   30 capsule   0   . citalopram (CELEXA) 20 MG tablet   Oral   Take 1 tablet (20 mg total) by mouth daily.   30 tablet   0   . HYDROcodone-acetaminophen (NORCO/VICODIN) 5-325 MG per tablet   Oral   Take 1 tablet by mouth every 4 (four) hours as needed for moderate pain.   20 tablet   0   . ibuprofen (ADVIL,MOTRIN) 800 MG tablet   Oral   Take 1 tablet (800 mg total) by mouth every 8 (eight) hours as needed.   30 tablet   0   . losartan (COZAAR) 25 MG tablet   Oral   Take 25 mg by mouth 2 (two) times daily.         . metoCLOPramide (REGLAN) 10 MG tablet   Oral   Take 1 tablet (10 mg total) by mouth every 6 (six) hours as needed for nausea or vomiting.   12 tablet   1   . sulfamethoxazole-trimethoprim (BACTRIM DS,SEPTRA DS) 800-160 MG per tablet  Oral   Take 1 tablet by mouth 2 (two) times daily.   20 tablet   0     Allergies Amoxicillin and Penicillins  No family history on file.  Social History Social History  Substance Use Topics  . Smoking status: Current Some Day Smoker -- 1.00 packs/day    Types: Cigarettes  . Smokeless tobacco: Never Used  . Alcohol Use: No    Review of Systems Constitutional: No fever/chills Eyes: No visual changes. ENT: No URI Cardiovascular: Denies chest pain. Respiratory: Denies shortness of breath. Gastrointestinal: No abdominal pain.  No nausea, no vomiting.  No diarrhea.  Musculoskeletal: Negative for back pain. Skin: Negative for rash. Neurological: Negative for headaches, focal weakness or numbness. Psychiatric:Normal mood Endocrine:No recent weight change 10-point ROS otherwise negative.  ____________________________________________   PHYSICAL EXAM:  VITAL SIGNS: ED Triage Vitals  Enc  Vitals Group     BP 08/10/15 1643 236/129 mmHg     Pulse Rate 08/10/15 1637 89     Resp 08/10/15 1637 20     Temp 08/10/15 1637 98.3 F (36.8 C)     Temp Source 08/10/15 1637 Oral     SpO2 08/10/15 1637 100 %     Weight 08/10/15 1637 202 lb (91.627 kg)     Height 08/10/15 1637 4\' 11"  (1.499 m)     Head Cir --      Peak Flow --      Pain Score 08/10/15 1637 10     Pain Loc --      Pain Edu? --      Excl. in GC? --    Constitutional: Alert and oriented. Well appearing and in no acute distress. Eyes: Conjunctivae are normal. PERRL. EOMI. Head: Atraumatic. Nose: No congestion/rhinnorhea. Mouth/Throat: Mucous membranes are moist.  Oropharynx non-erythematous. Neck: No stridor.   Lymphatic: No cervical lymphadenopathy. Cardiovascular: Normal rate, regular rhythm. Grossly normal heart sounds.  Peripheral pulses 2+ B Respiratory: Normal respiratory effort.  No retractions. Lungs CTAB. Gastrointestinal: Soft and nontender. No distention. Normal bowel sounds.  Musculoskeletal: No lower extremity tenderness nor edema.  No calf TTP. Neurologic:  Normal speech and language. No gross focal neurologic deficits are appreciated. Speech is normal.  Skin:  Skin is warm, dry and intact. No rash noted. Psychiatric: Mood and affect are normal. Speech and behavior are normal.  ____________________________________________   LABS (all labs ordered are listed, but only abnormal results are displayed)  Labs Reviewed  COMPREHENSIVE METABOLIC PANEL - Abnormal; Notable for the following:    Potassium 3.4 (*)    Total Protein 8.6 (*)    All other components within normal limits  ACETAMINOPHEN LEVEL - Abnormal; Notable for the following:    Acetaminophen (Tylenol), Serum <10 (*)    All other components within normal limits  CBC - Abnormal; Notable for the following:    MCV 77.3 (*)    MCH 25.2 (*)    RDW 17.0 (*)    Platelets 466 (*)    All other components within normal limits  URINALYSIS  COMPLETEWITH MICROSCOPIC ([facility] ONLY) - Abnormal; Notable for the following:    Color, Urine YELLOW (*)    APPearance CLEAR (*)    Hgb urine dipstick 2+ (*)    Bacteria, UA RARE (*)    Squamous Epithelial / LPF 0-5 (*)    All other components within normal limits  ETHANOL  SALICYLATE LEVEL  TROPONIN I  PREGNANCY, URINE  TSH  URINE DRUG SCREEN, QUALITATIVE ([facility] ONLY)  POCT PREGNANCY, URINE   ____________________________________________  EKG  ED ECG REPORT I, Maurilio Lovely, the attending physician, personally viewed and interpreted this ECG.   Date: 08/10/2015  EKG Time: 1648  Rate:84  Rhythm:nsr  Axis: nl  Intervals:nl QRS: LVH  ST&T Change: nl  ____________________________________________  RADIOLOGY  [id number] ____________________________________________   PROCEDURES  Procedure(s) performed: none  Critical Care performed: none ____________________________________________   INITIAL IMPRESSION / ASSESSMENT AND PLAN / ED COURSE  Pertinent labs & imaging results that were available during my care of the patient were reviewed by me and considered in my medical decision making (see chart for details).  Patient was seen and evaluated by Dr. Toni Amend. He feels that she is safe to be discharged home with follow-up with [facility]. Patient and her mother are in agreement with this. He wrote her a prescription to start on Celexa. I will also have her follow-up at [facility] for her hypertension.  ----------------------------------------- 7:14 PM on 08/10/2015 -----------------------------------------  Filed Vitals:   08/10/15 1800 08/10/15 1902  BP: 190/121 175/97  Pulse: 86 84  Temp:    Resp: 23 23   HA somewhat improved.  Will give IV toradol.   ____________________________________________   FINAL CLINICAL IMPRESSION(S) / ED DIAGNOSES  Final diagnoses:  Depression, major, recurrent, moderate (HCC)  Essential hypertension       Maurilio Lovely,  MD 08/10/15 2030

## 2015-08-10 NOTE — ED Notes (Addendum)
Pt reportd having "crying spells" and recently "not feeling well mentally for the past "couple weeks." Pt denies SI/HI to MD but states she sometimes has thoughts that she would be better off not being here. Pt denies past suicide attempts and denies self harm. Pt reports hx of postpartum depression after giving birth in 2007 and reports depression like symptoms and off since then. Pt was not treated for this. Pt reports working different hours at work that cause her to have difficulty sleeping. Pt verbalizes having surgery in July (cyst removal). Pt was hospitalized due to that and was not able to work. Pt reports this caused financial problems that have contributed to stress and anxiety recently. Pt also verbalized having anxiety about cysts returning. Pt reports today especially feeling financially overwhelmed.

## 2015-08-10 NOTE — BH Assessment (Signed)
Per request of Psych MD (Dr. Toni Amend), writer provided the pt. with information and instructions on how to access Outpatient Mental Health Treatment ([facility] and [facility]). Informed ER MD ([place]) as well.

## 2015-08-10 NOTE — ED Notes (Addendum)
Pt presents with mother to ed with reports of having suicidal thoughts with no plan. Denies any HI at this time. Pt is here voluntary and is wanting help. Pt bp in triage is elevated, pt did not take her losartan today. Pt also reports some palpatations and some chest discomfort today.

## 2015-10-21 IMAGING — CR DG KNEE COMPLETE 4+V*L*
1 series · 4 of 4 positions shown · non-contrast
Comparison: None.

CLINICAL DATA: Fell down stairs with left knee injury, pain and
swelling, initial encounter

EXAM:
LEFT KNEE - COMPLETE 4+ VIEW

[Series 1: dg knee complete 4 views left · 0.14mm/px · 4 of 4 slices shown]
[im 1/4]
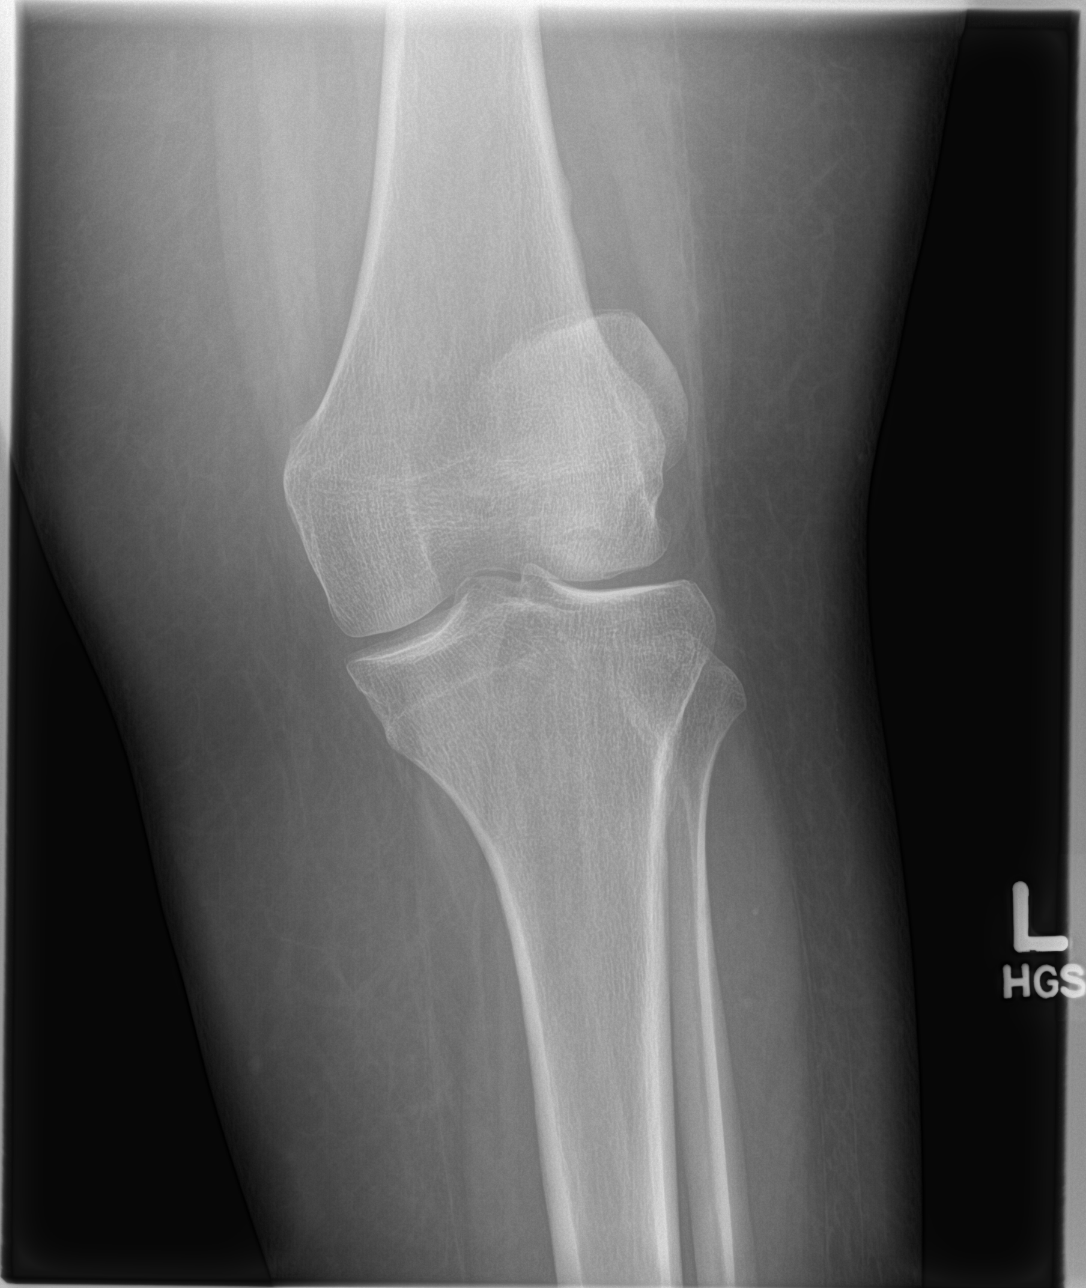
[im 2/4]
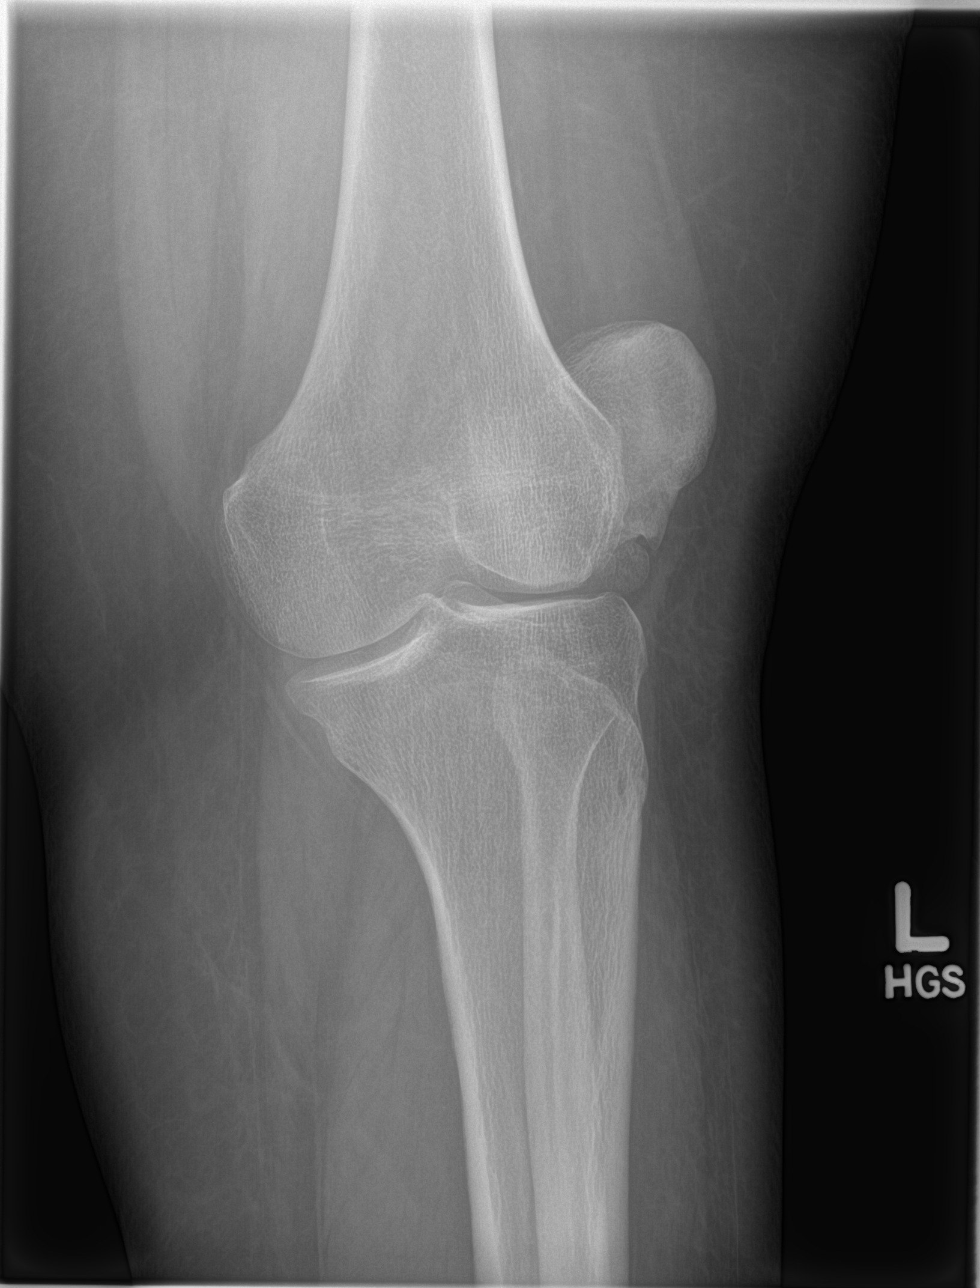
[im 3/4]
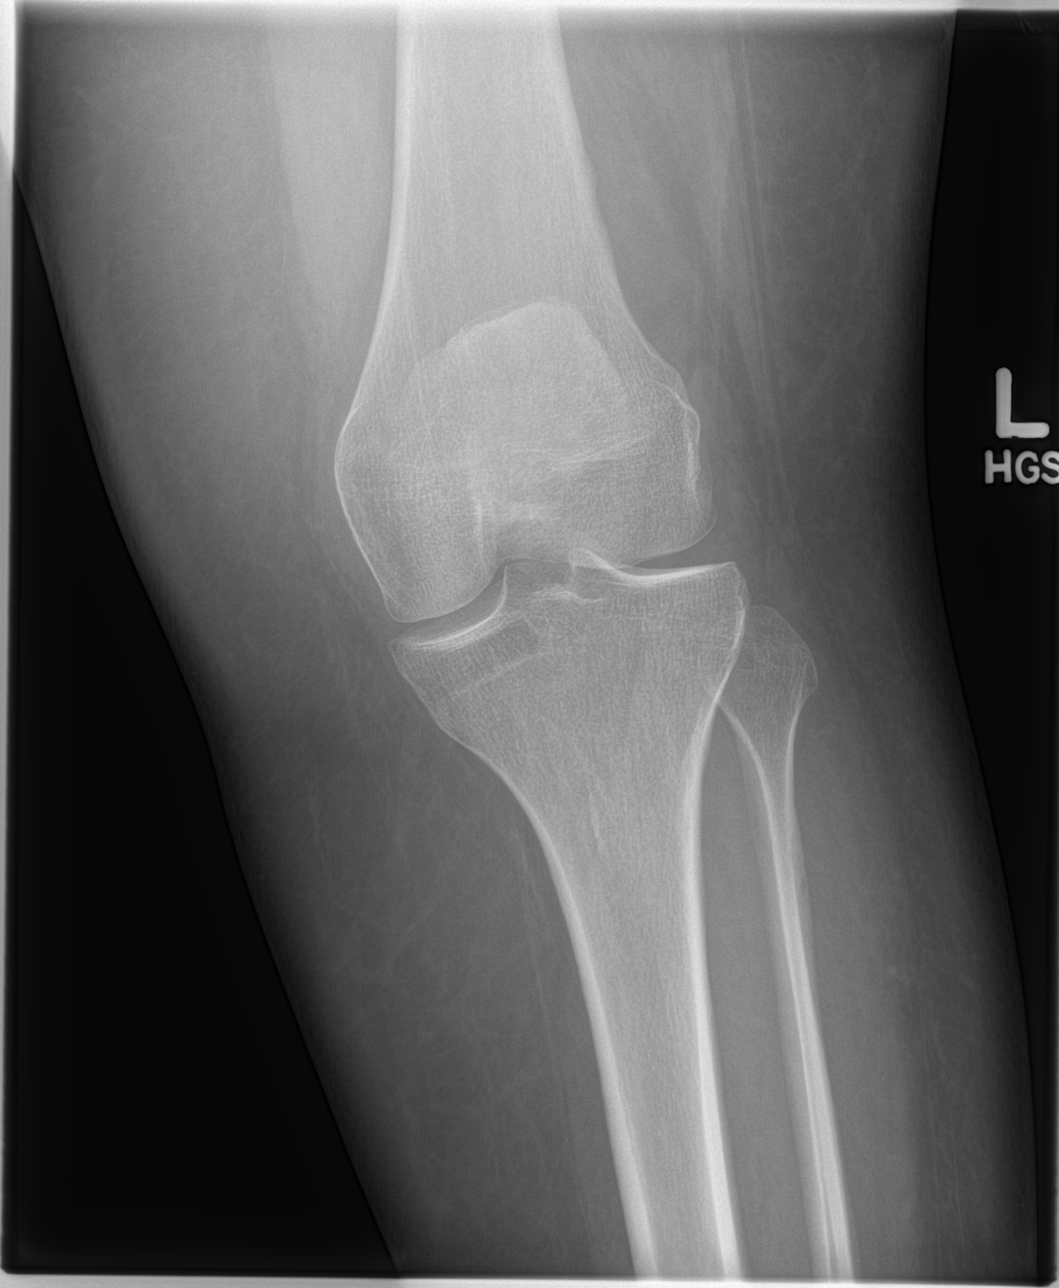
[im 4/4]
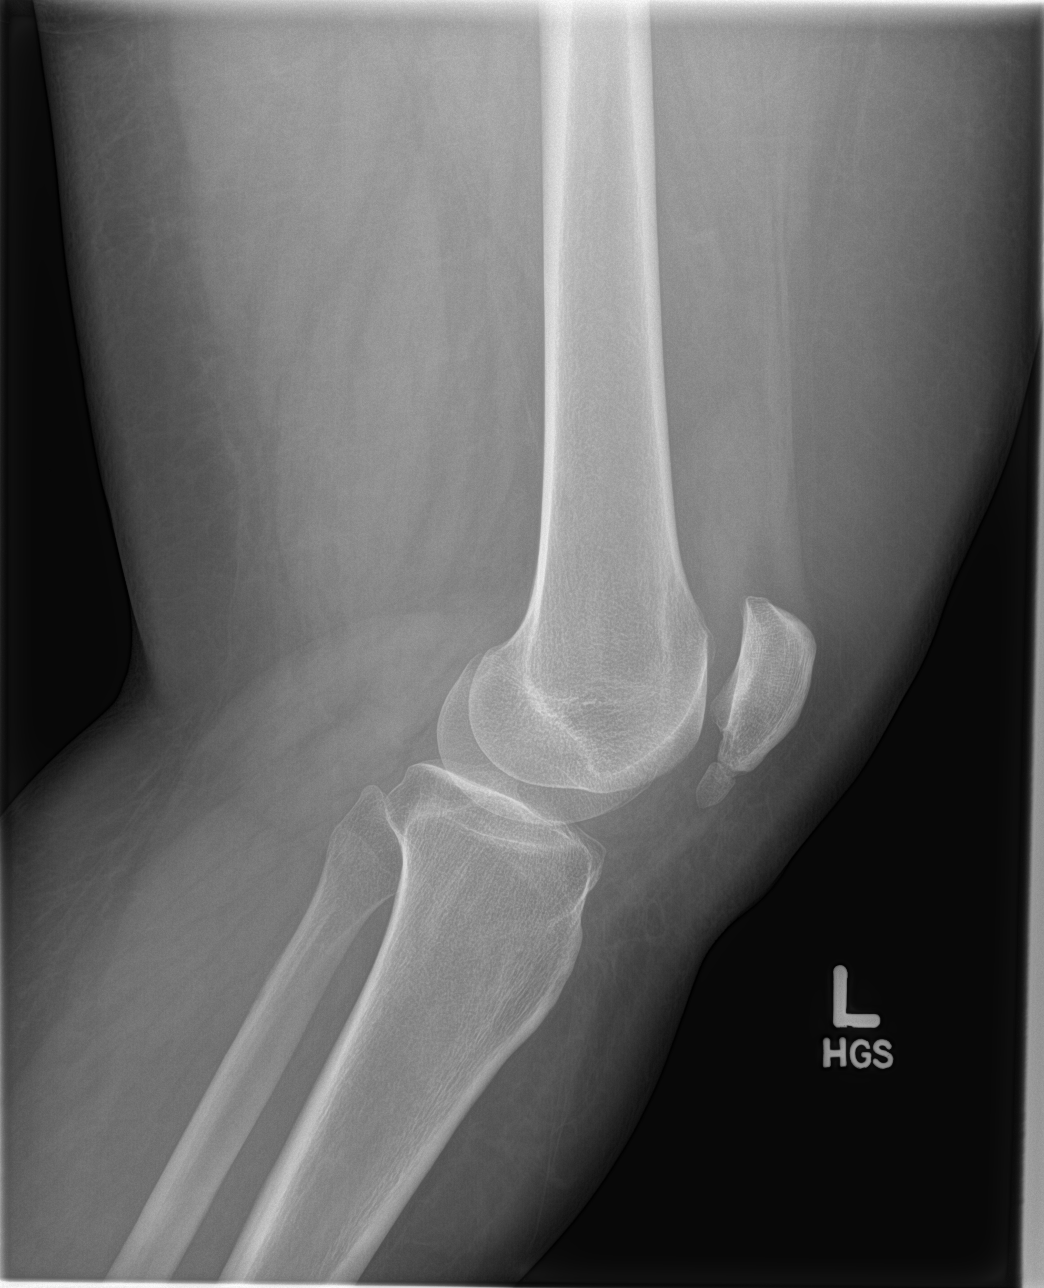

[4 of 4 positions shown; findings below may reference images not displayed]

FINDINGS: There is no evidence of fracture, dislocation, or joint effusion.
There is no evidence of arthropathy or other focal bone abnormality.
Soft tissues are unremarkable. The well corticated bony density is
noted adjacent to the inferior aspect of the patella. This likely
related to prior trauma or possible sesamoid bone.
IMPRESSION: No acute abnormality seen.

## 2015-12-29 ENCOUNTER — Encounter: Payer: Self-pay | Admitting: Emergency Medicine

## 2015-12-29 ENCOUNTER — Emergency Department
Admission: EM | Admit: 2015-12-29 | Discharge: 2015-12-29 | Disposition: A | Discharge status: Home / Self Care | Payer: Self-pay | Attending: Emergency Medicine | Admitting: Emergency Medicine

## 2015-12-29 DIAGNOSIS — I10 Essential (primary) hypertension: Secondary | ICD-10-CM | POA: Insufficient documentation

## 2015-12-29 DIAGNOSIS — N390 Urinary tract infection, site not specified: Secondary | ICD-10-CM | POA: Insufficient documentation

## 2015-12-29 DIAGNOSIS — F17210 Nicotine dependence, cigarettes, uncomplicated: Secondary | ICD-10-CM | POA: Insufficient documentation

## 2015-12-29 HISTORY — DX: Unspecified amblyopia, right eye: H53.001

## 2015-12-29 LAB — URINALYSIS COMPLETE WITH MICROSCOPIC (ARMC ONLY)
Bacteria, UA: NONE SEEN
Bilirubin Urine: NEGATIVE
Glucose, UA: NEGATIVE mg/dL
KETONES UR: NEGATIVE mg/dL
Nitrite: NEGATIVE
PH: 5 (ref 5.0–8.0)
PROTEIN: 30 mg/dL — AB
SPECIFIC GRAVITY, URINE: 1.029 (ref 1.005–1.030)

## 2015-12-29 LAB — POCT PREGNANCY, URINE: PREG TEST UR: NEGATIVE

## 2015-12-29 MED ORDER — FLUCONAZOLE 150 MG PO TABS: 150.0000 mg | ORAL_TABLET | Freq: Every day | ORAL | Status: DC

## 2015-12-29 MED ORDER — SULFAMETHOXAZOLE-TRIMETHOPRIM 800-160 MG PO TABS
1.0000 | ORAL_TABLET | Freq: Once | ORAL | Status: AC
  Administered 2015-12-29: 1 via ORAL
  Filled 2015-12-29: qty 1

## 2015-12-29 MED ORDER — SULFAMETHOXAZOLE-TRIMETHOPRIM 800-160 MG PO TABS: 1.0000 | ORAL_TABLET | Freq: Two times a day (BID) | ORAL | Status: DC

## 2015-12-29 MED ORDER — PHENAZOPYRIDINE HCL 200 MG PO TABS: 200.0000 mg | ORAL_TABLET | Freq: Three times a day (TID) | ORAL | Status: DC | PRN

## 2015-12-29 NOTE — ED Provider Notes (Signed)
[facility] Emergency Department Provider Note  ____________________________________________  Time seen: Approximately 5:42 PM  I have reviewed the triage vital signs and the nursing notes.   HISTORY  Chief Complaint Back Pain and Urinary Frequency    HPI Maria Tapia is a 30 y.o. female presents for evaluation of possible UTI. Patient reports dysuria and frequency for some low back pain. She reports being on a lot of antibiotics after surgery and feels like it may be given her UTI. She denies any discharge at this time.   Past Medical History  Diagnosis Date  . Hypertension   . Scoliosis   . Lazy eye of right side     Patient Active Problem List   Diagnosis Date Noted  . Suicidal ideation 08/10/2015  . Depression, major, recurrent, moderate (HCC) 08/10/2015    Past Surgical History  Procedure Laterality Date  . C-cection     . Eye surgery    . Pilonidal cyst excision      Current Outpatient Rx  Name  Route  Sig  Dispense  Refill  . citalopram (CELEXA) 20 MG tablet   Oral   Take 1 tablet (20 mg total) by mouth daily.   30 tablet   0   . fluconazole (DIFLUCAN) 150 MG tablet   Oral   Take 1 tablet (150 mg total) by mouth daily.   1 tablet   0   . ibuprofen (ADVIL,MOTRIN) 800 MG tablet   Oral   Take 1 tablet (800 mg total) by mouth every 8 (eight) hours as needed.   30 tablet   0   . losartan (COZAAR) 25 MG tablet   Oral   Take 25 mg by mouth 2 (two) times daily.         . metoCLOPramide (REGLAN) 10 MG tablet   Oral   Take 1 tablet (10 mg total) by mouth every 6 (six) hours as needed for nausea or vomiting.   12 tablet   1   . phenazopyridine (PYRIDIUM) 200 MG tablet   Oral   Take 1 tablet (200 mg total) by mouth 3 (three) times daily as needed for pain.   6 tablet   0   . sulfamethoxazole-trimethoprim (BACTRIM DS,SEPTRA DS) 800-160 MG tablet   Oral   Take 1 tablet by mouth 2 (two) times daily.   20 tablet   0      Allergies Amoxicillin and Penicillins  No family history on file.  Social History Social History  Substance Use Topics  . Smoking status: Current Every Day Smoker -- 1.00 packs/day    Types: Cigarettes  . Smokeless tobacco: Never Used  . Alcohol Use: No    Review of Systems Constitutional: No fever/chills Cardiovascular: Denies chest pain. Respiratory: Denies shortness of breath. Gastrointestinal: No abdominal pain.  No nausea, no vomiting.  No diarrhea.  No constipation. Genitourinary: Positive for discharge. Musculoskeletal: Positive for low back pain. Skin: Negative for rash. Neurological: Negative for headaches, focal weakness or numbness.  10-point ROS otherwise negative.  ____________________________________________   PHYSICAL EXAM:  VITAL SIGNS: ED Triage Vitals  Enc Vitals Group     BP 12/29/15 1704 156/90 mmHg     Pulse --      Resp 12/29/15 1704 22     Temp 12/29/15 1704 98.5 F (36.9 C)     Temp Source 12/29/15 1704 Oral     SpO2 12/29/15 1704 100 %     Weight 12/29/15 1702 208 lb (94.348  kg)     Height 12/29/15 1702 4\' 11"  (1.499 m)     Head Cir --      Peak Flow --      Pain Score 12/29/15 1705 10     Pain Loc --      Pain Edu? --      Excl. in GC? --     Constitutional: Alert and oriented. Well appearing and in no acute distress. Cardiovascular: Normal rate, regular rhythm. Grossly normal heart sounds.  Good peripheral circulation. Respiratory: Normal respiratory effort.  No retractions. Lungs CTAB. Gastrointestinal: Soft and nontender. No distention. No abdominal bruits. No CVA tenderness. Musculoskeletal: No lower extremity tenderness nor edema.  No joint effusions. Neurologic:  Normal speech and language. No gross focal neurologic deficits are appreciated. No gait instability. Skin:  Skin is warm, dry and intact. No rash noted. Psychiatric: Mood and affect are normal. Speech and behavior are  normal.  ____________________________________________   LABS (all labs ordered are listed, but only abnormal results are displayed)  Labs Reviewed  URINALYSIS COMPLETEWITH MICROSCOPIC ([facility] ONLY) - Abnormal; Notable for the following:    Color, Urine YELLOW (*)    APPearance CLOUDY (*)    Hgb urine dipstick 2+ (*)    Protein, ur 30 (*)    Leukocytes, UA 1+ (*)    Squamous Epithelial / LPF TOO NUMEROUS TO COUNT (*)    All other components within normal limits  POC URINE PREG, ED  POCT PREGNANCY, URINE   ____________________________________________  EKG   ____________________________________________  RADIOLOGY   ____________________________________________   PROCEDURES  Procedure(s) performed: None  Critical Care performed: No  ____________________________________________   INITIAL IMPRESSION / ASSESSMENT AND PLAN / ED COURSE  Pertinent labs & imaging results that were available during my care of the patient were reviewed by me and considered in my medical decision making (see chart for details).  Acute urinary tract infection. Rx given for Bactrim DS twice a day for 10 days pertinent or milligrams 3 times a day 2 days. Tetracaine 150 mg by mouth 1 at the end of the treatment. Patient is follow-up PCP or return to ER with any worsening symptomology. ____________________________________________   FINAL CLINICAL IMPRESSION(S) / ED DIAGNOSES  Final diagnoses:  UTI (lower urinary tract infection)     This chart was dictated using voice recognition software/Dragon. Despite best efforts to proofread, errors can occur which can change the meaning. Any change was purely unintentional.   Evangeline Dakinharles M Beers, PA-C 12/29/15 1832  Jennye MoccasinBrian S Quigley, MD 12/29/15 1911

## 2015-12-29 NOTE — ED Notes (Signed)
Patient presents to the ED with lower back pain and discomfort, urinary frequency and halting urination x 1 month that has been increasing.  Patient states pain began when she had a surgery to remove a polynidal cyst.  Patient is in no obvious distress at this time.  Patient states, "I was on a lot of antibiotics after my surgery and I think they gave me a UTI."  Patient denies vaginal discharge.

## 2015-12-29 NOTE — ED Notes (Signed)
See triage note   States she thinks she has a UTI has dysuria and freq  But also had recent surgery and was placed on antibiotic developed yeast infection

## 2015-12-29 NOTE — Discharge Instructions (Signed)

## 2016-03-21 ENCOUNTER — Encounter: Payer: Self-pay | Admitting: Emergency Medicine

## 2016-03-21 ENCOUNTER — Emergency Department
Admission: EM | Admit: 2016-03-21 | Discharge: 2016-03-21 | Disposition: A | Discharge status: Home / Self Care | Payer: Self-pay | Attending: Emergency Medicine | Admitting: Emergency Medicine

## 2016-03-21 DIAGNOSIS — N939 Abnormal uterine and vaginal bleeding, unspecified: Secondary | ICD-10-CM | POA: Insufficient documentation

## 2016-03-21 DIAGNOSIS — I10 Essential (primary) hypertension: Secondary | ICD-10-CM | POA: Insufficient documentation

## 2016-03-21 DIAGNOSIS — F17210 Nicotine dependence, cigarettes, uncomplicated: Secondary | ICD-10-CM | POA: Insufficient documentation

## 2016-03-21 MED ORDER — MEDROXYPROGESTERONE ACETATE 10 MG PO TABS: 20.0000 mg | ORAL_TABLET | Freq: Every day | ORAL | 0 refills | Status: DC

## 2016-03-21 NOTE — ED Provider Notes (Signed)
[facility] Emergency Department Provider Note  ____________________________________________  Time seen: Approximately 9:32 AM  I have reviewed the triage vital signs and the nursing notes.   HISTORY  Chief Complaint Vaginal Bleeding    HPI Keyra Coker is a 30 y.o. female who complains of abnormal vaginal bleeding. She states that it's been going on for about 1 weeks, with spotting, but this morning there was a large amount of bleeding. She was changing her pad frequently. Her last menstrual cycle was about 4 weeks ago.No chest pain shortness of breath fevers or chills. No vaginal discharge. No abdominal pain.     Past Medical History:  Diagnosis Date  . Hypertension   . Lazy eye of right side   . Scoliosis      Patient Active Problem List   Diagnosis Date Noted  . Suicidal ideation 08/10/2015  . Depression, major, recurrent, moderate (HCC) 08/10/2015     Past Surgical History:  Procedure Laterality Date  . c-cection     . EYE SURGERY    . PILONIDAL CYST EXCISION    . REFRACTIVE SURGERY       Prior to Admission medications   Medication Sig Start Date End Date Taking? Authorizing Provider  citalopram (CELEXA) 20 MG tablet Take 1 tablet (20 mg total) by mouth daily. 08/10/15   Audery Amel, MD  fluconazole (DIFLUCAN) 150 MG tablet Take 1 tablet (150 mg total) by mouth daily. 12/29/15   Charmayne Sheer Beers, PA-C  ibuprofen (ADVIL,MOTRIN) 800 MG tablet Take 1 tablet (800 mg total) by mouth every 8 (eight) hours as needed. 11/15/14   Charmayne Sheer Beers, PA-C  losartan (COZAAR) 25 MG tablet Take 25 mg by mouth 2 (two) times daily.    Historical Provider, MD  medroxyPROGESTERone (PROVERA) 10 MG tablet Take 2 tablets (20 mg total) by mouth daily. 03/21/16   Sharman Cheek, MD  metoCLOPramide (REGLAN) 10 MG tablet Take 1 tablet (10 mg total) by mouth every 6 (six) hours as needed for nausea or vomiting. 11/24/14   Myrna Blazer, MD   phenazopyridine (PYRIDIUM) 200 MG tablet Take 1 tablet (200 mg total) by mouth 3 (three) times daily as needed for pain. 12/29/15   Charmayne Sheer Beers, PA-C  sulfamethoxazole-trimethoprim (BACTRIM DS,SEPTRA DS) 800-160 MG tablet Take 1 tablet by mouth 2 (two) times daily. 12/29/15   Evangeline Dakin, PA-C     Allergies Amoxicillin and Penicillins   No family history on file.  Social History Social History  Substance Use Topics  . Smoking status: Current Every Day Smoker    Packs/day: 1.00    Types: Cigarettes  . Smokeless tobacco: Never Used  . Alcohol use No    Review of Systems  Constitutional:   No fever or chills. No fatigue Respiratory: No shortness of breath. Gastrointestinal:   Negative for abdominal pain, vomiting and diarrhea.  Genitourinary:   Negative for dysuria or difficulty urinating.  Neurological:   Negative for headaches. No dizziness or lightheadedness. No syncope. 10-point ROS otherwise negative.  ____________________________________________   PHYSICAL EXAM:  VITAL SIGNS: ED Triage Vitals  Enc Vitals Group     BP 03/21/16 0839 (!) 165/103     Pulse Rate 03/21/16 0839 87     Resp 03/21/16 0839 18     Temp 03/21/16 0839 98 F (36.7 C)     Temp Source 03/21/16 0839 Oral     SpO2 03/21/16 0839 100 %     Weight 03/21/16 0840 208  lb (94.3 kg)     Height 03/21/16 0840 4\' 11"  (1.499 m)     Head Circumference --      Peak Flow --      Pain Score 03/21/16 0840 10     Pain Loc --      Pain Edu? --      Excl. in GC? --     Vital signs reviewed, nursing assessments reviewed.   Constitutional:   Alert and oriented. Well appearing and in no distress. Eyes:   No scleral icterus. No conjunctival pallor. PERRL. EOMI.  No nystagmus. ENT   Head:   Normocephalic and atraumatic.   Nose:   No congestion/rhinnorhea. No septal hematoma   Mouth/Throat:   MMM, no pharyngeal erythema. No peritonsillar mass.    Neck:   No stridor. No SubQ emphysema. No  meningismus. Hematological/Lymphatic/Immunilogical:   No cervical lymphadenopathy. Cardiovascular:   RRR. Symmetric bilateral radial and DP pulses.  No murmurs.  Respiratory:   Normal respiratory effort without tachypnea nor retractions. Breath sounds are clear and equal bilaterally. No wheezes/rales/rhonchi. Gastrointestinal:   Soft and nontender. Non distended. There is no CVA tenderness.  No rebound, rigidity, or guarding. Genitourinary:   External exam unremarkable. Speculum exam reveals scant amount of fresh blood in the vault without any significant active bleeding. Musculoskeletal:   Nontender with normal range of motion in all extremities. No joint effusions.  No lower extremity tenderness.  No edema. Neurologic:   Normal speech and language.  CN 2-10 normal. Motor grossly intact. No gross focal neurologic deficits are appreciated.  Skin:    Skin is warm, dry and intact. No rash noted.  No petechiae, purpura, or bullae.  ____________________________________________    LABS (pertinent positives/negatives) (all labs ordered are listed, but only abnormal results are displayed) Labs Reviewed - No data to display ____________________________________________   EKG    ____________________________________________    RADIOLOGY    ____________________________________________   PROCEDURES Procedures  ____________________________________________   INITIAL IMPRESSION / ASSESSMENT AND PLAN / ED COURSE  Pertinent labs & imaging results that were available during my care of the patient were reviewed by me and considered in my medical decision making (see chart for details).  Patient well appearing no acute distress. Complains of abnormal vaginal bleeding. Exam is reassuring. No evidence of significant anemia. Normal vital signs. Low suspicion for pregnancy or ectopic. It seems that her bleeding has stopped on its own. I provided the patient a prescription for Provera to take  if the bleeding accelerates again. Otherwise, [facility] which I encouraged her to follow up with. Also encourage her to follow up with gynecology. Return precautions given.   Clinical Course   ____________________________________________   FINAL CLINICAL IMPRESSION(S) / ED DIAGNOSES  Final diagnoses:  Abnormal uterine bleeding (AUB)       Portions of this note were generated with dragon dictation software. Dictation errors may occur despite best attempts at proofreading.    Sharman CheekPhillip Shatona Andujar, MD 03/21/16 [id number]

## 2016-03-21 NOTE — ED Triage Notes (Signed)
Patient reports having vaginal bleeding since last Sunday. States she has h/o fibroids. States bleeding today is heavier than 1 pad per hour.

## 2016-11-05 ENCOUNTER — Encounter: Payer: Self-pay | Admitting: Emergency Medicine

## 2016-11-05 ENCOUNTER — Emergency Department
Admission: EM | Admit: 2016-11-05 | Discharge: 2016-11-05 | Disposition: A | Discharge status: Home / Self Care | Payer: Self-pay | Attending: Emergency Medicine | Admitting: Emergency Medicine

## 2016-11-05 DIAGNOSIS — I10 Essential (primary) hypertension: Secondary | ICD-10-CM | POA: Insufficient documentation

## 2016-11-05 DIAGNOSIS — F17210 Nicotine dependence, cigarettes, uncomplicated: Secondary | ICD-10-CM | POA: Insufficient documentation

## 2016-11-05 DIAGNOSIS — N3001 Acute cystitis with hematuria: Secondary | ICD-10-CM | POA: Insufficient documentation

## 2016-11-05 LAB — URINALYSIS, COMPLETE (UACMP) WITH MICROSCOPIC
BACTERIA UA: NONE SEEN
Bilirubin Urine: NEGATIVE
GLUCOSE, UA: NEGATIVE mg/dL
Ketones, ur: NEGATIVE mg/dL
LEUKOCYTES UA: NEGATIVE
NITRITE: NEGATIVE
PH: 5 (ref 5.0–8.0)
Protein, ur: 30 mg/dL — AB
SPECIFIC GRAVITY, URINE: 1.027 (ref 1.005–1.030)

## 2016-11-05 LAB — POCT PREGNANCY, URINE: Preg Test, Ur: NEGATIVE

## 2016-11-05 MED ORDER — SULFAMETHOXAZOLE-TRIMETHOPRIM 800-160 MG PO TABS
1.0000 | ORAL_TABLET | Freq: Two times a day (BID) | ORAL | Status: DC
  Administered 2016-11-05: 1 via ORAL
  Filled 2016-11-05: qty 1

## 2016-11-05 MED ORDER — SULFAMETHOXAZOLE-TRIMETHOPRIM 800-160 MG PO TABS: 1.0000 | ORAL_TABLET | Freq: Two times a day (BID) | ORAL | 0 refills | Status: AC

## 2016-11-05 NOTE — ED Notes (Signed)
POC urine done. Result negative.

## 2016-11-05 NOTE — ED Triage Notes (Signed)
Patient presents to the ED with dysuria and urinary urgency with lower back pain and pelvic pain.  Patient states, "this is what it usually feels like when I get a UTI, I get them a lot."  Patient states she has also had a headache for the past few days.  Patient is in no obvious distress at this time.  Patient texting on phone.

## 2016-11-05 NOTE — ED Provider Notes (Signed)
[facility] Emergency Department Provider Note  ____________________________________________  Time seen: Approximately 11:47 PM  I have reviewed the triage vital signs and the nursing notes.   HISTORY  Chief Complaint Dysuria; Back Pain; and Headache    HPI Maria Tapia is a 31 y.o. female presenting to the emergency department with dysuria and increased urinary frequency for the past three days. She has been afebrile. Patient denies bilateral flank pain. Triage note noted. Patient has noticed no hematuria. Patient states "it feels like the last time I had a UTI". Patient denies nausea, vomiting abdominal pain and changes in vaginal discharge. No alleviating measures about attempted.   Past Medical History:  Diagnosis Date  . Hypertension   . Lazy eye of right side   . Scoliosis     Patient Active Problem List   Diagnosis Date Noted  . Suicidal ideation 08/10/2015  . Depression, major, recurrent, moderate (HCC) 08/10/2015    Past Surgical History:  Procedure Laterality Date  . c-cection     . EYE SURGERY    . PILONIDAL CYST EXCISION    . REFRACTIVE SURGERY      Prior to Admission medications   Medication Sig Start Date End Date Taking? Authorizing Provider  citalopram (CELEXA) 20 MG tablet Take 1 tablet (20 mg total) by mouth daily. 08/10/15   Clapacs, Jackquline DenmarkJohn T, MD  fluconazole (DIFLUCAN) 150 MG tablet Take 1 tablet (150 mg total) by mouth daily. 12/29/15   Beers, Charmayne Sheerharles M, PA-C  ibuprofen (ADVIL,MOTRIN) 800 MG tablet Take 1 tablet (800 mg total) by mouth every 8 (eight) hours as needed. 11/15/14   Beers, Charmayne Sheerharles M, PA-C  losartan (COZAAR) 25 MG tablet Take 25 mg by mouth 2 (two) times daily.    [provider]  medroxyPROGESTERone (PROVERA) 10 MG tablet Take 2 tablets (20 mg total) by mouth daily. 03/21/16   Sharman CheekStafford, Phillip, MD  metoCLOPramide (REGLAN) 10 MG tablet Take 1 tablet (10 mg total) by mouth every 6 (six) hours as needed for  nausea or vomiting. 11/24/14   Schaevitz, Myra Rudeavid Matthew, MD  phenazopyridine (PYRIDIUM) 200 MG tablet Take 1 tablet (200 mg total) by mouth 3 (three) times daily as needed for pain. 12/29/15   Beers, Charmayne Sheerharles M, PA-C  sulfamethoxazole-trimethoprim (BACTRIM DS) 800-160 MG tablet Take 1 tablet by mouth 2 (two) times daily. 11/05/16 11/15/16  Orvil FeilWoods, Austen Oyster M, PA-C    Allergies Amoxicillin and Penicillins  No family history on file.  Social History Social History  Substance Use Topics  . Smoking status: Current Every Day Smoker    Packs/day: 1.00    Types: Cigarettes  . Smokeless tobacco: Never Used  . Alcohol use No     Review of Systems  Constitutional: No fever/chills Eyes: No visual changes. No discharge ENT: No upper respiratory complaints. Cardiovascular: no chest pain. Respiratory: no cough. No SOB. Gastrointestinal: No abdominal pain.  No nausea, no vomiting.  No diarrhea.  No constipation. Genitourinary: Patient has dysuria and increased urinary frequency. Skin: Negative for rash, abrasions, lacerations, ecchymosis.  .  ____________________________________________   PHYSICAL EXAM:  VITAL SIGNS: ED Triage Vitals  Enc Vitals Group     BP 11/05/16 1812 (!) 155/102     Pulse Rate 11/05/16 1812 96     Resp 11/05/16 1812 16     Temp 11/05/16 1812 98.6 F (37 C)     Temp Source 11/05/16 1812 Oral     SpO2 11/05/16 1812 100 %     Weight  11/05/16 1815 200 lb (90.7 kg)     Height 11/05/16 1815 4\' 10"  (1.473 m)     Head Circumference --      Peak Flow --      Pain Score 11/05/16 1821 10     Pain Loc --      Pain Edu? --      Excl. in GC? --      Constitutional: Alert and oriented. Well appearing and in no acute distress. Eyes: Conjunctivae are normal. PERRL. EOMI. Head: Atraumatic. Hematological/Lymphatic/Immunilogical: No cervical lymphadenopathy. Cardiovascular: Normal rate, regular rhythm. Normal S1 and S2.  Good peripheral circulation. Respiratory: Normal  respiratory effort without tachypnea or retractions. Lungs CTAB. Good air entry to the bases with no decreased or absent breath sounds. Gastrointestinal: Bowel sounds 4 quadrants. Soft and nontender to palpation. No guarding or rigidity. No palpable masses. No distention. No CVA tenderness. Musculoskeletal: Full range of motion to all extremities. No gross deformities appreciated. Neurologic:  Normal speech and language. No gross focal neurologic deficits are appreciated.  Skin:  Skin is warm, dry and intact. No rash noted. Psychiatric: Mood and affect are normal. Speech and behavior are normal. Patient exhibits appropriate insight and judgement.   ____________________________________________   LABS (all labs ordered are listed, but only abnormal results are displayed)  Labs Reviewed  URINALYSIS, COMPLETE (UACMP) WITH MICROSCOPIC - Abnormal; Notable for the following:       Result Value   Color, Urine AMBER (*)    APPearance HAZY (*)    Hgb urine dipstick SMALL (*)    Protein, ur 30 (*)    Squamous Epithelial / LPF 0-5 (*)    All other components within normal limits  POC URINE PREG, ED  POCT PREGNANCY, URINE   ____________________________________________  EKG   ____________________________________________  RADIOLOGY   No results found.  ____________________________________________    PROCEDURES  Procedure(s) performed:    Procedures    Medications - No data to display   ____________________________________________   INITIAL IMPRESSION / ASSESSMENT AND PLAN / ED COURSE  Pertinent labs & imaging results that were available during my care of the patient were reviewed by me and considered in my medical decision making (see chart for details).  Review of the [place] CSRS was performed in accordance of the [facility] prior to dispensing any controlled drugs.     Assessment and plan: Acute Cystitis:  Patient presents to the emergency department with dysuria and  increased urinary frequency. Urinalysis was noncontributory for acute cystitis. Patient was treated empirically with Bactrim. Vital signs were reassuring prior to discharge. Patient was advised to follow-up with primary care as needed. All patient questions were answered.   ____________________________________________  FINAL CLINICAL IMPRESSION(S) / ED DIAGNOSES  Final diagnoses:  Acute cystitis with hematuria      NEW MEDICATIONS STARTED DURING THIS VISIT:  Discharge Medication List as of 11/05/2016  8:32 PM          This chart was dictated using voice recognition software/Dragon. Despite best efforts to proofread, errors can occur which can change the meaning. Any change was purely unintentional.    Orvil Feil, PA-C 11/08/16 1856    Phineas Semen, MD 11/13/16 [id number]

## 2017-04-01 ENCOUNTER — Emergency Department: Payer: Self-pay

## 2017-04-01 ENCOUNTER — Emergency Department
Admission: EM | Admit: 2017-04-01 | Discharge: 2017-04-01 | Disposition: A | Discharge status: Home / Self Care | Payer: Self-pay | Attending: Emergency Medicine | Admitting: Emergency Medicine

## 2017-04-01 DIAGNOSIS — F17210 Nicotine dependence, cigarettes, uncomplicated: Secondary | ICD-10-CM | POA: Insufficient documentation

## 2017-04-01 DIAGNOSIS — R519 Headache, unspecified: Secondary | ICD-10-CM

## 2017-04-01 DIAGNOSIS — N939 Abnormal uterine and vaginal bleeding, unspecified: Secondary | ICD-10-CM | POA: Insufficient documentation

## 2017-04-01 DIAGNOSIS — R51 Headache: Secondary | ICD-10-CM | POA: Insufficient documentation

## 2017-04-01 DIAGNOSIS — R079 Chest pain, unspecified: Secondary | ICD-10-CM | POA: Insufficient documentation

## 2017-04-01 DIAGNOSIS — I10 Essential (primary) hypertension: Secondary | ICD-10-CM | POA: Insufficient documentation

## 2017-04-01 DIAGNOSIS — Z79899 Other long term (current) drug therapy: Secondary | ICD-10-CM | POA: Insufficient documentation

## 2017-04-01 LAB — POCT PREGNANCY, URINE: Preg Test, Ur: NEGATIVE

## 2017-04-01 LAB — CBC
HCT: 33.5 % — ABNORMAL LOW (ref 35.0–47.0)
Hemoglobin: 10.8 g/dL — ABNORMAL LOW (ref 12.0–16.0)
MCH: 24.3 pg — AB (ref 26.0–34.0)
MCHC: 32.2 g/dL (ref 32.0–36.0)
MCV: 75.5 fL — ABNORMAL LOW (ref 80.0–100.0)
Platelets: 412 10*3/uL (ref 150–440)
RBC: 4.44 MIL/uL (ref 3.80–5.20)
RDW: 18.7 % — AB (ref 11.5–14.5)
WBC: 7.4 10*3/uL (ref 3.6–11.0)

## 2017-04-01 LAB — BASIC METABOLIC PANEL
Anion gap: 7 (ref 5–15)
BUN: 14 mg/dL (ref 6–20)
CALCIUM: 8.6 mg/dL — AB (ref 8.9–10.3)
CO2: 26 mmol/L (ref 22–32)
CREATININE: 0.6 mg/dL (ref 0.44–1.00)
Chloride: 104 mmol/L (ref 101–111)
GFR calc Af Amer: >60 mL/min (ref >60)
GFR calc non Af Amer: >60 mL/min (ref >60)
GLUCOSE: 127 mg/dL — AB (ref 65–99)
Potassium: 3.6 mmol/L (ref 3.5–5.1)
Sodium: 137 mmol/L (ref 135–145)

## 2017-04-01 LAB — TROPONIN I

## 2017-04-01 MED ORDER — BUTALBITAL-APAP-CAFFEINE 50-325-40 MG PO TABS
2.0000 | ORAL_TABLET | Freq: Once | ORAL | Status: AC
Start: 2017-04-01 — End: 2017-04-01
  Administered 2017-04-01: 2 via ORAL
  Filled 2017-04-01: qty 2

## 2017-04-01 NOTE — ED Triage Notes (Signed)
Pt arrived via POV - she reports that she is having chest pain with nausea and dizziness - pt also reports that she has a headache d/t elevated BP - Pt then states that she has been on her period for the last 3 weeks

## 2017-04-01 NOTE — ED Provider Notes (Signed)
[facility] Emergency Department Provider Note  Time seen: 2:37 PM  I have reviewed the triage vital signs and the nursing notes.   HISTORY  Chief Complaint Chest Pain; Headache; and Vaginal Bleeding    HPI Maria Tapia is a 31 y.o. female With a past medical history of hypertension, depression, presents to the emergency department multiple complaints. Patient states over the past several days she has been expressing intermittent chest pain, headaches, and 3 weeks of vaginal bleeding. Patient states 3 weeks ago she started her period, states it is not stopped. Denies any abdominal or pelvic pain. Denies any discharge. Patient states this is happened before when she had an ovarian cyst. Patient also states for the past 2-3 days she's been experiencing intermittent chest pain and a moderate headache. Denies any focal weakness or numbness. Denies any fever.  Past Medical History:  Diagnosis Date  . Hypertension   . Lazy eye of right side   . Scoliosis     Patient Active Problem List   Diagnosis Date Noted  . Suicidal ideation 08/10/2015  . Depression, major, recurrent, moderate (HCC) 08/10/2015    Past Surgical History:  Procedure Laterality Date  . c-cection     . EYE SURGERY    . PILONIDAL CYST EXCISION    . REFRACTIVE SURGERY      Prior to Admission medications   Medication Sig Start Date End Date Taking? Authorizing Provider  labetalol (NORMODYNE) 100 MG tablet Take 100 mg by mouth daily.   Yes [provider]  losartan (COZAAR) 25 MG tablet Take 25 mg by mouth 2 (two) times daily.   Yes [provider]  citalopram (CELEXA) 20 MG tablet Take 1 tablet (20 mg total) by mouth daily. Patient not taking: Reported on 04/01/2017 08/10/15   Clapacs, Jackquline Denmark, MD  fluconazole (DIFLUCAN) 150 MG tablet Take 1 tablet (150 mg total) by mouth daily. Patient not taking: Reported on 04/01/2017 12/29/15   Evangeline Dakin, PA-C  ibuprofen  (ADVIL,MOTRIN) 800 MG tablet Take 1 tablet (800 mg total) by mouth every 8 (eight) hours as needed. Patient not taking: Reported on 04/01/2017 11/15/14   Evangeline Dakin, PA-C  medroxyPROGESTERone (PROVERA) 10 MG tablet Take 2 tablets (20 mg total) by mouth daily. Patient not taking: Reported on 04/01/2017 03/21/16   Sharman Cheek, MD  metoCLOPramide (REGLAN) 10 MG tablet Take 1 tablet (10 mg total) by mouth every 6 (six) hours as needed for nausea or vomiting. Patient not taking: Reported on 04/01/2017 11/24/14   Myrna Blazer, MD  phenazopyridine (PYRIDIUM) 200 MG tablet Take 1 tablet (200 mg total) by mouth 3 (three) times daily as needed for pain. Patient not taking: Reported on 04/01/2017 12/29/15   Evangeline Dakin, PA-C    Allergies  Allergen Reactions  . Penicillins Anaphylaxis    Has patient had a PCN reaction causing immediate rash, facial/tongue/throat swelling, SOB or lightheadedness with hypotension: Yes Has patient had a PCN reaction causing severe rash involving mucus membranes or skin necrosis: No Has patient had a PCN reaction that required hospitalization: No Has patient had a PCN reaction occurring within the last 10 years: No If all of the above answers are "NO", then may proceed with Cephalosporin use.   Marland Kitchen Amoxicillin Hives    No family history on file.  Social History Social History  Substance Use Topics  . Smoking status: Current Every Day Smoker    Packs/day: 1.00    Types: Cigarettes  .  Smokeless tobacco: Never Used  . Alcohol use No    Review of Systems Constitutional: Negative for fever. Cardiovascular: intermittent chest pain 3 days, none currently Respiratory: Negative for shortness of breath. Gastrointestinal: Negative for abdominal pain, vomiting and diarrhea.positive for nausea. Genitourinary: Negative for dysuria.negative for vaginal discharge. as it for vaginal bleeding. Neurological: moderate headache without focal weakness or  numbness All other ROS negative  ____________________________________________   PHYSICAL EXAM:  VITAL SIGNS: ED Triage Vitals  Enc Vitals Group     BP 04/01/17 1202 (!) 187/114     Pulse Rate 04/01/17 1202 95     Resp 04/01/17 1202 16     Temp 04/01/17 1202 98.2 F (36.8 C)     Temp Source 04/01/17 1202 Oral     SpO2 04/01/17 1202 100 %     Weight 04/01/17 1204 196 lb (88.9 kg)     Height 04/01/17 1204  (1.473 m)     Head Circumference --      Peak Flow --      Pain Score 04/01/17 1213 10     Pain Loc --      Pain Edu? --      Excl. in GC? --    Constitutional: Alert and oriented. Well appearing and in no distress. Eyes: strabismus, chronic ENT   Head: Normocephalic and atraumatic.   Mouth/Throat: Mucous membranes are moist. Cardiovascular: Normal rate, regular rhythm. No murmur Respiratory: Normal respiratory effort without tachypnea nor retractions. Breath sounds are clear Gastrointestinal: Soft and nontender. No distention.   Musculoskeletal: Nontender with normal range of motion in all extremities.  Neurologic:  Normal speech and language. No gross focal neurologic deficits Skin:  Skin is warm, dry and intact.  Psychiatric: Mood and affect are normal.   ____________________________________________    EKG  EKG reviewed and interpreted by myself shows normal sinus rhythm at 84 bpm, narrow QRS, normal axis, normal intervals, no ST changes.  ____________________________________________    RADIOLOGY   IMPRESSION: Mild cardiomegaly and scoliosis. No acute findings.  ____________________________________________   INITIAL IMPRESSION / ASSESSMENT AND PLAN / ED COURSE  Pertinent labs & imaging results that were available during my care of the patient were reviewed by me and considered in my medical decision making (see chart for details).  patient presents to the emergency department multiple complaints, mostly of intermittent chest pain over the  past 2-3 days, moderate headache in 3 weeks of vaginal bleeding. Denies any vaginal discharge pelvic pain or abdominal pain. differential at this time would include menorrhagia, ectopic pregnancy, ACS. We will check labs including cardiac enzymes, chest x-ray, EKG. We will obtain a pelvic ultrasound to further evaluate.  Patient's workup thus far is largely within normal limits. Chest x-ray is normal, EKG is normal, labs are normal H&H his baseline. We will obtain an ultrasound. Patient care signed out to oncoming physician. Overall the patient appears extremely well, no distress. Only complaint is moderate headache we will treat with Fioricet. Neurological exam is intact besides strabismus which is long-standing.  I have reviewed the patient's records, baseline hemoglobin appears to be between 10 and 12, 10.8 today.   ____________________________________________   FINAL CLINICAL IMPRESSION(S) / ED DIAGNOSES  Chest pain Headache Vaginal bleeding    Minna Antis, MD 04/01/17 1443

## 2017-04-01 NOTE — Discharge Instructions (Addendum)
Please seek medical attention for any high fevers, chest pain, shortness of breath, change in behavior, persistent vomiting, bloody stool or any other new or concerning symptoms.  

## 2017-04-01 NOTE — ED Notes (Signed)
Patient refuses to give urine sample 

## 2017-04-01 NOTE — ED Notes (Signed)
Patient comes with complaints of intermittent chest pain that comes and goes, a headache that started this morning, vaginal bleeding since 03/15/17, swollen feet.  Right more swollen than left.  Patient is alert and oriented.

## 2017-04-24 ENCOUNTER — Emergency Department: Payer: Self-pay

## 2017-04-24 ENCOUNTER — Encounter: Payer: Self-pay | Admitting: Emergency Medicine

## 2017-04-24 ENCOUNTER — Emergency Department
Admission: EM | Admit: 2017-04-24 | Discharge: 2017-04-24 | Disposition: A | Discharge status: Home / Self Care | Payer: Self-pay | Attending: Emergency Medicine | Admitting: Emergency Medicine

## 2017-04-24 DIAGNOSIS — R0981 Nasal congestion: Secondary | ICD-10-CM | POA: Insufficient documentation

## 2017-04-24 DIAGNOSIS — F17210 Nicotine dependence, cigarettes, uncomplicated: Secondary | ICD-10-CM | POA: Insufficient documentation

## 2017-04-24 DIAGNOSIS — J069 Acute upper respiratory infection, unspecified: Secondary | ICD-10-CM | POA: Insufficient documentation

## 2017-04-24 DIAGNOSIS — Z5321 Procedure and treatment not carried out due to patient leaving prior to being seen by health care provider: Secondary | ICD-10-CM | POA: Insufficient documentation

## 2017-04-24 DIAGNOSIS — Z79899 Other long term (current) drug therapy: Secondary | ICD-10-CM | POA: Insufficient documentation

## 2017-04-24 DIAGNOSIS — B9789 Other viral agents as the cause of diseases classified elsewhere: Secondary | ICD-10-CM | POA: Insufficient documentation

## 2017-04-24 MED ORDER — IBUPROFEN 600 MG PO TABS: 600.0000 mg | ORAL_TABLET | Freq: Three times a day (TID) | ORAL | 0 refills | Status: DC | PRN

## 2017-04-24 MED ORDER — NAPROXEN 500 MG PO TABS
500.0000 mg | ORAL_TABLET | Freq: Once | ORAL | Status: AC
  Administered 2017-04-24: 500 mg via ORAL
  Filled 2017-04-24: qty 1

## 2017-04-24 MED ORDER — FEXOFENADINE-PSEUDOEPHED ER 60-120 MG PO TB12: 1.0000 | ORAL_TABLET | Freq: Two times a day (BID) | ORAL | 0 refills | Status: DC

## 2017-04-24 MED ORDER — BENZONATATE 100 MG PO CAPS
200.0000 mg | ORAL_CAPSULE | Freq: Once | ORAL | Status: AC
  Administered 2017-04-24: 200 mg via ORAL
  Filled 2017-04-24: qty 2

## 2017-04-24 MED ORDER — BENZONATATE 100 MG PO CAPS: 200.0000 mg | ORAL_CAPSULE | Freq: Three times a day (TID) | ORAL | 0 refills | Status: AC | PRN

## 2017-04-24 NOTE — ED Triage Notes (Signed)
Cough productive clear x 3 days.

## 2017-04-24 NOTE — ED Notes (Signed)
NAD noted at time of D/C. Pt denies questions or concerns. Pt ambulatory to the lobby at this time.  

## 2017-04-24 NOTE — ED Provider Notes (Signed)
[facility] Emergency Department Provider Note   ____________________________________________   First MD Initiated Contact with Patient 04/24/17 1358     (approximate)  I have reviewed the triage vital signs and the nursing notes.   HISTORY  Chief Complaint Cough    HPI Maria Tapia is a 31 y.o. female patient presented with 3 days of productive cough and chest congestion. Patient also state there is intermittent sinus congestion, postnasal drainage, and sore throat. Patient denies nausea, vomiting, diarrhea. Patient stated body ache secondary to coughing.  Past Medical History:  Diagnosis Date  . Hypertension   . Lazy eye of right side   . Scoliosis     Patient Active Problem List   Diagnosis Date Noted  . Suicidal ideation 08/10/2015  . Depression, major, recurrent, moderate (HCC) 08/10/2015    Past Surgical History:  Procedure Laterality Date  . c-cection     . EYE SURGERY    . PILONIDAL CYST EXCISION    . REFRACTIVE SURGERY      Prior to Admission medications   Medication Sig Start Date End Date Taking? Authorizing Provider  benzonatate (TESSALON PERLES) 100 MG capsule Take 2 capsules (200 mg total) by mouth 3 (three) times daily as needed for cough. 04/24/17 04/24/18  Joni Reining, PA-C  citalopram (CELEXA) 20 MG tablet Take 1 tablet (20 mg total) by mouth daily. Patient not taking: Reported on 04/01/2017 08/10/15   Clapacs, Jackquline Denmark, MD  fexofenadine-pseudoephedrine (ALLEGRA-D) 60-120 MG 12 hr tablet Take 1 tablet by mouth 2 (two) times daily. 04/24/17   Joni Reining, PA-C  fluconazole (DIFLUCAN) 150 MG tablet Take 1 tablet (150 mg total) by mouth daily. Patient not taking: Reported on 04/01/2017 12/29/15   Beers, Charmayne Sheer, PA-C  ibuprofen (ADVIL,MOTRIN) 600 MG tablet Take 1 tablet (600 mg total) by mouth every 8 (eight) hours as needed. 04/24/17   Joni Reining, PA-C  ibuprofen (ADVIL,MOTRIN) 800 MG tablet Take 1 tablet (800  mg total) by mouth every 8 (eight) hours as needed. Patient not taking: Reported on 04/01/2017 11/15/14   Evangeline Dakin, PA-C  labetalol (NORMODYNE) 100 MG tablet Take 100 mg by mouth daily.    [provider]  losartan (COZAAR) 25 MG tablet Take 25 mg by mouth 2 (two) times daily.    [provider]  medroxyPROGESTERone (PROVERA) 10 MG tablet Take 2 tablets (20 mg total) by mouth daily. Patient not taking: Reported on 04/01/2017 03/21/16   Sharman Cheek, MD  metoCLOPramide (REGLAN) 10 MG tablet Take 1 tablet (10 mg total) by mouth every 6 (six) hours as needed for nausea or vomiting. Patient not taking: Reported on 04/01/2017 11/24/14   Myrna Blazer, MD  phenazopyridine (PYRIDIUM) 200 MG tablet Take 1 tablet (200 mg total) by mouth 3 (three) times daily as needed for pain. Patient not taking: Reported on 04/01/2017 12/29/15   Evangeline Dakin, PA-C    Allergies Penicillins and Amoxicillin  No family history on file.  Social History Social History  Substance Use Topics  . Smoking status: Current Every Day Smoker    Packs/day: 1.00    Types: Cigarettes  . Smokeless tobacco: Never Used  . Alcohol use No    Review of Systems Constitutional: No fever/chills Eyes: No visual changes. ENT: No sore throat. Cardiovascular: Denies chest pain. Respiratory: Denies shortness of breath. Gastrointestinal: No abdominal pain.  No nausea, no vomiting.  No diarrhea.  No constipation. Genitourinary: Negative for dysuria.  Musculoskeletal: Negative for back pain. Skin: Negative for rash. Neurological: Negative for headaches, focal weakness or numbness. Psychiatric:Depression and suicidal ideations Endocrine:Hypertension  ____________________________________________   PHYSICAL EXAM:  VITAL SIGNS: ED Triage Vitals  Enc Vitals Group     BP 04/24/17 1302 (!) 146/86     Pulse Rate 04/24/17 1302 85     Resp 04/24/17 1302 (!) 28     Temp 04/24/17 1302 98.9 F (37.2  C)     Temp Source 04/24/17 1302 Oral     SpO2 04/24/17 1302 100 %     Weight 04/24/17 1305 196 lb (88.9 kg)     Height 04/24/17 1305 4\' 10"  (1.473 m)     Head Circumference --      Peak Flow --      Pain Score 04/24/17 1306 10     Pain Loc --      Pain Edu? --      Excl. in GC? --    Constitutional: Alert and oriented. Well appearing and in no acute distress. Eyes: Conjunctivae are normal. PERRL. EOMI. Head: Atraumatic. Nose: Bilateral maxillary guarding edematous nasal turbinates Mouth/Throat: Mucous membranes are moist.  Oropharynx non-erythematous. Postnasal drainage Neck: No stridor.   Cardiovascular: Normal rate, regular rhythm. Grossly normal heart sounds.  Good peripheral circulation. Respiratory: Normal respiratory effort.  No retractions. Lungs CTAB. Neurologic:  Normal speech and language. No gross focal neurologic deficits are appreciated. No gait instability. Skin:  Skin is warm, dry and intact. No rash noted. Psychiatric: Mood and affect are normal. Speech and behavior are normal.  ____________________________________________   LABS (all labs ordered are listed, but only abnormal results are displayed)  Labs Reviewed - No data to display ____________________________________________  EKG   ____________________________________________  RADIOLOGY  Dg Chest 2 View  Result Date: 04/24/2017 CLINICAL DATA:  One week of URI symptoms including chest congestion and cough. EXAM: CHEST  2 VIEW COMPARISON:  Chest x-ray of April 01, 2017 FINDINGS: The lungs are well-expanded. There is no focal infiltrate. There is no pleural effusion. The heart is top-normal in size but stable. The pulmonary vascularity is mildly prominent centrally. There is moderate to severe dextrocurvature centered in the mid upper thoracic spine. IMPRESSION: There is no acute pneumonia. Stable cardiomegaly without pulmonary vascular congestion or pulmonary edema. Electronically Signed   By: David   Swazil[place]Jordan M.D.   On: 04/24/2017 14:42    ____________________________________________   PROCEDURES  Procedure(s) performed: None  Procedures  Critical Care performed: No  ____________________________________________   INITIAL IMPRESSION / ASSESSMENT AND PLAN / ED COURSE  As part of my medical decision making, I reviewed the following data within the electronic MEDICAL RECORD NUMBER    . Patient presents with cough and congestion for 3 days secondary to respiratory infection. Discussed negative Hx x-ray findings. Patient given discharge Instructions advised take medication as directed. Patient advised follow-up with the "clinic if condition persists.      ____________________________________________   FINAL CLINICAL IMPRESSION(S) / ED DIAGNOSES  Final diagnoses:  Viral URI with cough      NEW MEDICATIONS STARTED DURING THIS VISIT:  New Prescriptions   BENZONATATE (TESSALON PERLES) 100 MG CAPSULE    Take 2 capsules (200 mg total) by mouth 3 (three) times daily as needed for cough.   FEXOFENADINE-PSEUDOEPHEDRINE (ALLEGRA-D) 60-120 MG 12 HR TABLET    Take 1 tablet by mouth 2 (two) times daily.   IBUPROFEN (ADVIL,MOTRIN) 600 MG TABLET    Take 1 tablet (600 mg total)  by mouth every 8 (eight) hours as needed.     Note:  This document was prepared using Dragon voice recognition software and may include unintentional dictation errors.    Joni Reining, PA-C 04/24/17 1501    Nita Sickle, MD 04/25/17 [phone]

## 2017-04-24 NOTE — ED Notes (Signed)
Pt noted leaving ED lobby 

## 2017-04-24 NOTE — ED Triage Notes (Signed)
Pt in with co cold symptoms for about a week with runny nose, congestion, and cough.

## 2017-08-16 ENCOUNTER — Encounter: Payer: Self-pay | Admitting: Emergency Medicine

## 2017-08-16 ENCOUNTER — Other Ambulatory Visit: Payer: Self-pay

## 2017-08-16 DIAGNOSIS — F419 Anxiety disorder, unspecified: Secondary | ICD-10-CM | POA: Insufficient documentation

## 2017-08-16 DIAGNOSIS — F17210 Nicotine dependence, cigarettes, uncomplicated: Secondary | ICD-10-CM | POA: Insufficient documentation

## 2017-08-16 DIAGNOSIS — Z79899 Other long term (current) drug therapy: Secondary | ICD-10-CM | POA: Insufficient documentation

## 2017-08-16 DIAGNOSIS — I10 Essential (primary) hypertension: Secondary | ICD-10-CM | POA: Insufficient documentation

## 2017-08-16 NOTE — ED Triage Notes (Signed)
Pt arrives via [facility] with c/o [facility] Pt last VS per EMS were 159/123, 100% RA and 100 PR. Pt CBG was 117. Pt is in NAD.

## 2017-08-17 ENCOUNTER — Emergency Department
Admission: EM | Admit: 2017-08-17 | Discharge: 2017-08-17 | Disposition: A | Discharge status: Home / Self Care | Payer: Self-pay | Attending: Emergency Medicine | Admitting: Emergency Medicine

## 2017-08-17 DIAGNOSIS — F419 Anxiety disorder, unspecified: Secondary | ICD-10-CM

## 2017-08-17 DIAGNOSIS — R51 Headache: Secondary | ICD-10-CM

## 2017-08-17 DIAGNOSIS — I10 Essential (primary) hypertension: Secondary | ICD-10-CM

## 2017-08-17 DIAGNOSIS — R519 Headache, unspecified: Secondary | ICD-10-CM

## 2017-08-17 MED ORDER — OXYCODONE-ACETAMINOPHEN 5-325 MG PO TABS
1.0000 | ORAL_TABLET | ORAL | Status: DC | PRN
  Administered 2017-08-17: 1 via ORAL
  Filled 2017-08-17: qty 1

## 2017-08-17 MED ORDER — LORAZEPAM 1 MG PO TABS
ORAL_TABLET | ORAL | Status: AC
  Administered 2017-08-17: 1 mg via ORAL
  Filled 2017-08-17: qty 1

## 2017-08-17 MED ORDER — LORAZEPAM 1 MG PO TABS
1.0000 mg | ORAL_TABLET | Freq: Once | ORAL | Status: AC
  Administered 2017-08-17: 1 mg via ORAL

## 2017-08-17 NOTE — ED Provider Notes (Signed)
[facility] Emergency Department Provider Note   ____________________________________________   First MD Initiated Contact with Patient 08/17/17 0120     (approximate)  I have reviewed the triage vital signs and the nursing notes.   HISTORY  Chief Complaint Headache and Anxiety    HPI Maria Tapia is a 32 y.o. female brought to the ED via EMS from [facility] with a chief complaint of elevated blood pressure and headache.  Patient reports she is "dealing with some stuff" right now and feels that she had a panic attack while at the [facility]  Headache is completely resolved since she had at triage and patient is eager for discharge.  Other than mild, gradual onset, global headache, patient denies recent fever, chills, vision changes, neck pain, chest pain, shortness of breath, abdominal pain, nausea or vomiting.  Denies active SI/HI/AH/VH.Marland Kitchen   Past Medical History:  Diagnosis Date  . Hypertension   . Lazy eye of right side   . Scoliosis     Patient Active Problem List   Diagnosis Date Noted  . Suicidal ideation 08/10/2015  . Depression, major, recurrent, moderate (HCC) 08/10/2015    Past Surgical History:  Procedure Laterality Date  . c-cection     . EYE SURGERY    . PILONIDAL CYST EXCISION    . REFRACTIVE SURGERY      Prior to Admission medications   Medication Sig Start Date End Date Taking? Authorizing Provider  benzonatate (TESSALON PERLES) 100 MG capsule Take 2 capsules (200 mg total) by mouth 3 (three) times daily as needed for cough. 04/24/17 04/24/18  Joni Reining, PA-C  citalopram (CELEXA) 20 MG tablet Take 1 tablet (20 mg total) by mouth daily. Patient not taking: Reported on 04/01/2017 08/10/15   Clapacs, Jackquline Denmark, MD  fexofenadine-pseudoephedrine (ALLEGRA-D) 60-120 MG 12 hr tablet Take 1 tablet by mouth 2 (two) times daily. 04/24/17   Joni Reining, PA-C  fluconazole (DIFLUCAN) 150 MG tablet Take 1 tablet (150 mg total) by mouth  daily. Patient not taking: Reported on 04/01/2017 12/29/15   Beers, Charmayne Sheer, PA-C  ibuprofen (ADVIL,MOTRIN) 600 MG tablet Take 1 tablet (600 mg total) by mouth every 8 (eight) hours as needed. 04/24/17   Joni Reining, PA-C  ibuprofen (ADVIL,MOTRIN) 800 MG tablet Take 1 tablet (800 mg total) by mouth every 8 (eight) hours as needed. Patient not taking: Reported on 04/01/2017 11/15/14   Evangeline Dakin, PA-C  labetalol (NORMODYNE) 100 MG tablet Take 100 mg by mouth daily.    [provider]  losartan (COZAAR) 25 MG tablet Take 25 mg by mouth 2 (two) times daily.    [provider]  medroxyPROGESTERone (PROVERA) 10 MG tablet Take 2 tablets (20 mg total) by mouth daily. Patient not taking: Reported on 04/01/2017 03/21/16   Sharman Cheek, MD  metoCLOPramide (REGLAN) 10 MG tablet Take 1 tablet (10 mg total) by mouth every 6 (six) hours as needed for nausea or vomiting. Patient not taking: Reported on 04/01/2017 11/24/14   Myrna Blazer, MD  phenazopyridine (PYRIDIUM) 200 MG tablet Take 1 tablet (200 mg total) by mouth 3 (three) times daily as needed for pain. Patient not taking: Reported on 04/01/2017 12/29/15   Evangeline Dakin, PA-C    Allergies Penicillins and Amoxicillin  No family history on file.  Social History Social History   Tobacco Use  . Smoking status: Current Every Day Smoker    Packs/day: 1.00    Types: Cigarettes  .  Smokeless tobacco: Never Used  Substance Use Topics  . Alcohol use: No  . Drug use: No    Review of Systems  Constitutional: No fever/chills. Eyes: No visual changes. ENT: No sore throat. Cardiovascular: Denies chest pain. Respiratory: Denies shortness of breath. Gastrointestinal: No abdominal pain.  No nausea, no vomiting.  No diarrhea.  No constipation. Genitourinary: Negative for dysuria. Musculoskeletal: Negative for back pain. Skin: Negative for rash. Neurological: Positive for headache.  Negative for focal weakness or  numbness. Psychiatric:Positive for anxiety.  ____________________________________________   PHYSICAL EXAM:  VITAL SIGNS: ED Triage Vitals  Enc Vitals Group     BP 08/17/17 0013 (!) 164/120     Pulse Rate 08/17/17 0013 93     Resp 08/17/17 0013 20     Temp 08/17/17 0013 97.9 F (36.6 C)     Temp Source 08/17/17 0013 Oral     SpO2 08/17/17 0013 100 %     Weight 08/16/17 2352 198 lb (89.8 kg)     Height 08/16/17 2352 5' (1.524 m)     Head Circumference --      Peak Flow --      Pain Score 08/16/17 2351 10     Pain Loc --      Pain Edu? --      Excl. in GC? --     Constitutional: Alert and oriented. Well appearing and in no acute distress. Eyes: Conjunctivae are normal. PERRL. EOMI. Head: Atraumatic. Nose: No congestion/rhinnorhea. Mouth/Throat: Mucous membranes are moist.  Oropharynx non-erythematous. Neck: No stridor.  No carotid bruits. Cardiovascular: Normal rate, regular rhythm. Grossly normal heart sounds.  Good peripheral circulation. Respiratory: Normal respiratory effort.  No retractions. Lungs CTAB. Gastrointestinal: Soft and nontender. No distention. No abdominal bruits. No CVA tenderness. Musculoskeletal: No lower extremity tenderness nor edema.  No joint effusions. Neurologic:  Normal speech and language. No gross focal neurologic deficits are appreciated. No gait instability. Skin:  Skin is warm, dry and intact. No rash noted. Psychiatric: Mood and affect are mildly anxious. Speech and behavior are normal.  ____________________________________________   LABS (all labs ordered are listed, but only abnormal results are displayed)  Labs Reviewed - No data to display ____________________________________________  EKG  Patient refused ____________________________________________  RADIOLOGY  ED MD interpretation: None  Official radiology report(s): No results found.  ____________________________________________   PROCEDURES  Procedure(s)  performed: None  Procedures  Critical Care performed: No  ____________________________________________   INITIAL IMPRESSION / ASSESSMENT AND PLAN / ED COURSE  As part of my medical decision making, I reviewed the following data within the electronic MEDICAL RECORD NUMBER Nursing notes reviewed and incorporated, Old chart reviewed and Notes from prior ED visits.   32 year old female with hypertension who presents with elevated blood pressure, headache and panic attacks/anxiety.  Patient received Percocet in triage, states her headache has completely resolved and is eager for discharge.  She denies SI/HI/AH/VH.  She is angrily texting on her cell phone during our interview and examination.  Refuses further evaluation with lab work and EKG.  States she is compliant with her blood pressure medicines.  Strict return precautions given.  Patient verbalizes understanding and agrees with plan of care.      ____________________________________________   FINAL CLINICAL IMPRESSION(S) / ED DIAGNOSES  Final diagnoses:  Acute nonintractable headache, unspecified headache type  Essential hypertension  Anxiety     ED Discharge Orders    None       Note:  This document was prepared using  [employer]agon [employer] engineervoice recognition software and may include unintentional dictation errors.    Irean HongSung, Deovion Batrez J, MD 08/17/17 [phone]

## 2017-08-17 NOTE — ED Notes (Signed)

## 2017-08-17 NOTE — ED Notes (Signed)
Patient refusal of EKG verbally ordered by MD Dolores FrameSung.

## 2017-08-17 NOTE — Discharge Instructions (Signed)
Take your blood pressure medicines daily.  Return to the ER for worsening symptoms, persistent vomiting, difficulty breathing or other concerns.

## 2017-08-25 ENCOUNTER — Emergency Department
Admission: EM | Admit: 2017-08-25 | Discharge: 2017-08-25 | Disposition: A | Discharge status: Home / Self Care | Payer: Self-pay | Attending: Emergency Medicine | Admitting: Emergency Medicine

## 2017-08-25 ENCOUNTER — Encounter: Payer: Self-pay | Admitting: Emergency Medicine

## 2017-08-25 DIAGNOSIS — I10 Essential (primary) hypertension: Secondary | ICD-10-CM | POA: Insufficient documentation

## 2017-08-25 DIAGNOSIS — F17210 Nicotine dependence, cigarettes, uncomplicated: Secondary | ICD-10-CM | POA: Insufficient documentation

## 2017-08-25 DIAGNOSIS — F331 Major depressive disorder, recurrent, moderate: Secondary | ICD-10-CM

## 2017-08-25 DIAGNOSIS — F329 Major depressive disorder, single episode, unspecified: Secondary | ICD-10-CM | POA: Insufficient documentation

## 2017-08-25 DIAGNOSIS — Z79899 Other long term (current) drug therapy: Secondary | ICD-10-CM | POA: Insufficient documentation

## 2017-08-25 LAB — COMPREHENSIVE METABOLIC PANEL
ALK PHOS: 77 U/L (ref 38–126)
ALT: 12 U/L — ABNORMAL LOW (ref 14–54)
ANION GAP: 11 (ref 5–15)
AST: 18 U/L (ref 15–41)
Albumin: 4.2 g/dL (ref 3.5–5.0)
BUN: 12 mg/dL (ref 6–20)
CALCIUM: 9.3 mg/dL (ref 8.9–10.3)
CO2: 26 mmol/L (ref 22–32)
Chloride: 102 mmol/L (ref 101–111)
Creatinine, Ser: 0.79 mg/dL (ref 0.44–1.00)
GFR calc non Af Amer: >60 mL/min (ref >60)
Glucose, Bld: 96 mg/dL (ref 65–99)
Potassium: 3.2 mmol/L — ABNORMAL LOW (ref 3.5–5.1)
SODIUM: 139 mmol/L (ref 135–145)
Total Bilirubin: 0.7 mg/dL (ref 0.3–1.2)
Total Protein: 8.6 g/dL — ABNORMAL HIGH (ref 6.5–8.1)

## 2017-08-25 LAB — CBC
HCT: 40.1 % (ref 35.0–47.0)
HEMOGLOBIN: 12.4 g/dL (ref 12.0–16.0)
MCH: 22.5 pg — ABNORMAL LOW (ref 26.0–34.0)
MCHC: 31 g/dL — AB (ref 32.0–36.0)
MCV: 72.4 fL — ABNORMAL LOW (ref 80.0–100.0)
PLATELETS: 525 10*3/uL — AB (ref 150–440)
RBC: 5.53 MIL/uL — ABNORMAL HIGH (ref 3.80–5.20)
RDW: 19.6 % — AB (ref 11.5–14.5)
WBC: 8.7 10*3/uL (ref 3.6–11.0)

## 2017-08-25 LAB — SALICYLATE LEVEL

## 2017-08-25 LAB — ETHANOL: Alcohol, Ethyl (B): <10 mg/dL (ref <10)

## 2017-08-25 LAB — ACETAMINOPHEN LEVEL

## 2017-08-25 MED ORDER — SERTRALINE HCL 25 MG PO TABS: 25.0000 mg | ORAL_TABLET | Freq: Every day | ORAL | 0 refills | Status: DC

## 2017-08-25 MED ORDER — HYDROXYZINE PAMOATE 25 MG PO CAPS: 25.0000 mg | ORAL_CAPSULE | Freq: Every evening | ORAL | 0 refills | Status: DC | PRN

## 2017-08-25 MED ORDER — HYDROXYZINE HCL 25 MG PO TABS
25.0000 mg | ORAL_TABLET | Freq: Once | ORAL | Status: AC
  Administered 2017-08-25: 25 mg via ORAL
  Filled 2017-08-25: qty 1

## 2017-08-25 MED ORDER — SERTRALINE HCL 50 MG PO TABS
25.0000 mg | ORAL_TABLET | Freq: Every day | ORAL | Status: DC
  Administered 2017-08-25: 25 mg via ORAL
  Filled 2017-08-25: qty 1

## 2017-08-25 NOTE — ED Notes (Signed)
Pt to be discharged home. Pt has called her mother for a ride.

## 2017-08-25 NOTE — ED Notes (Signed)
Pt calm and cooperative. Pt stated she needs to speak with a psychiatrist. "All I do is sit at home and cry."   Denies SI/HI/AVH. Pt reports she has never seen  psychiatrist before. Does not take any psychiatric medications.

## 2017-08-25 NOTE — ED Notes (Signed)
Pt discharged home with mother. Pt denies SI/HI and AVH. Discharge paperwork reviewed with patient. All belongings returned to patient. Two prescriptions given to patient.

## 2017-08-25 NOTE — ED Triage Notes (Signed)
Pt comes into the ED via POV with her mother c/o increased depression and wanting to see a psychiatrist.  Mother states that she has not been eating lately and has been crying nonstop.  Patient denies any current SI or HI but is here voluntarily.  Patient in NAD at this time with even and unlabored respirations.

## 2017-08-25 NOTE — Discharge Instructions (Signed)
Start hydroxyzine and sertraline as prescribed.  You will need to follow up with a mental health provider in the next 2 weeks for further evaluation and continued medication management.  Continue monitoring your high blood pressure and follow up with your doctor for possible dosage adjustments.

## 2017-08-25 NOTE — ED Provider Notes (Signed)
[facility] Emergency Department Provider Note  ____________________________________________  Time seen: Approximately 6:16 PM  I have reviewed the triage vital signs and the nursing notes.   HISTORY  Chief Complaint Psychiatric Evaluation    HPI Maria Tapia is a 32 y.o. female who complains of depressed mood, worsening over the past 2 months. Interrupted sleep. Poor appetite. Loss of concentration. Denies SI HI or hallucinations. No history of antidepressant use. No history of self-harm. Symptoms are constant, worsening, no alleviating factors. Worsened after stressors at home in the last 2 weeks. Patient rates it as severe.  She takes losartan for hypertension as it has been compliant with this.  No headache, visual change, paresthesia, weakness, chest pain, shortness of breath.   Past Medical History:  Diagnosis Date  . Hypertension   . Lazy eye of right side   . Scoliosis      Patient Active Problem List   Diagnosis Date Noted  . Suicidal ideation 08/10/2015  . Depression, major, recurrent, moderate (HCC) 08/10/2015     Past Surgical History:  Procedure Laterality Date  . c-cection     . EYE SURGERY    . PILONIDAL CYST EXCISION    . REFRACTIVE SURGERY       Prior to Admission medications   Medication Sig Start Date End Date Taking? Authorizing Provider  benzonatate (TESSALON PERLES) 100 MG capsule Take 2 capsules (200 mg total) by mouth 3 (three) times daily as needed for cough. 04/24/17 04/24/18  Joni ReiningSmith, Ronald K, PA-C  citalopram (CELEXA) 20 MG tablet Take 1 tablet (20 mg total) by mouth daily. Patient not taking: Reported on 04/01/2017 08/10/15   Clapacs, Jackquline DenmarkJohn T, MD  fexofenadine-pseudoephedrine (ALLEGRA-D) 60-120 MG 12 hr tablet Take 1 tablet by mouth 2 (two) times daily. 04/24/17   Joni ReiningSmith, Ronald K, PA-C  fluconazole (DIFLUCAN) 150 MG tablet Take 1 tablet (150 mg total) by mouth daily. Patient not taking: Reported on 04/01/2017  12/29/15   Evangeline DakinBeers, Charles M, PA-C  hydrOXYzine (VISTARIL) 25 MG capsule Take 1 capsule (25 mg total) by mouth at bedtime as needed. 08/25/17   Sharman CheekStafford, Shellene Sweigert, MD  ibuprofen (ADVIL,MOTRIN) 600 MG tablet Take 1 tablet (600 mg total) by mouth every 8 (eight) hours as needed. 04/24/17   Joni ReiningSmith, Ronald K, PA-C  ibuprofen (ADVIL,MOTRIN) 800 MG tablet Take 1 tablet (800 mg total) by mouth every 8 (eight) hours as needed. Patient not taking: Reported on 04/01/2017 11/15/14   Evangeline DakinBeers, Charles M, PA-C  labetalol (NORMODYNE) 100 MG tablet Take 100 mg by mouth daily.    [provider]  losartan (COZAAR) 25 MG tablet Take 25 mg by mouth 2 (two) times daily.    [provider]  medroxyPROGESTERone (PROVERA) 10 MG tablet Take 2 tablets (20 mg total) by mouth daily. Patient not taking: Reported on 04/01/2017 03/21/16   Sharman CheekStafford, Aniella Wandrey, MD  metoCLOPramide (REGLAN) 10 MG tablet Take 1 tablet (10 mg total) by mouth every 6 (six) hours as needed for nausea or vomiting. Patient not taking: Reported on 04/01/2017 11/24/14   Myrna BlazerSchaevitz, David Matthew, MD  phenazopyridine (PYRIDIUM) 200 MG tablet Take 1 tablet (200 mg total) by mouth 3 (three) times daily as needed for pain. Patient not taking: Reported on 04/01/2017 12/29/15   Evangeline DakinBeers, Charles M, PA-C  sertraline (ZOLOFT) 25 MG tablet Take 1 tablet (25 mg total) by mouth daily. 08/25/17 08/25/18  Sharman CheekStafford, Jashley Yellin, MD     Allergies Penicillins and Amoxicillin   No family history on file.  Social History Social History   Tobacco Use  . Smoking status: Current Every Day Smoker    Packs/day: 1.00    Types: Cigarettes  . Smokeless tobacco: Never Used  Substance Use Topics  . Alcohol use: No  . Drug use: No    Review of Systems  Constitutional:   No fever or chills.  ENT:   No sore throat. No rhinorrhea. Cardiovascular:   No chest pain or syncope. Respiratory:   No dyspnea or cough. Gastrointestinal:   Negative for abdominal pain, vomiting and  diarrhea.  Musculoskeletal:   Negative for focal pain or swelling All other systems reviewed and are negative except as documented above in ROS and HPI.  ____________________________________________   PHYSICAL EXAM:  VITAL SIGNS: ED Triage Vitals  Enc Vitals Group     BP 08/25/17 1519 (!) 201/118     Pulse Rate 08/25/17 1519 80     Resp 08/25/17 1519 17     Temp 08/25/17 1519 98.4 F (36.9 C)     Temp Source 08/25/17 1519 Oral     SpO2 08/25/17 1519 100 %     Weight 08/25/17 1521 198 lb (89.8 kg)     Height 08/25/17 1521 5' (1.524 m)     Head Circumference --      Peak Flow --      Pain Score 08/25/17 1521 8     Pain Loc --      Pain Edu? --      Excl. in GC? --     Vital signs reviewed, nursing assessments reviewed.   Constitutional:   Alert and oriented. Well appearing and in no distress. Eyes:   No scleral icterus.  EOMI.  ENT   Head:   Normocephalic and atraumatic.  Hematological/Lymphatic/Immunilogical:   No cervical lymphadenopathy. Cardiovascular:   RRR. Symmetric bilateral radial and DP pulses.  No murmurs.  Respiratory:   Normal respiratory effort without tachypnea/retractions. Breath sounds are clear and equal bilaterally. No wheezes/rales/rhonchi.  Musculoskeletal:   Normal range of motion in all extremities. No joint effusions.  No lower extremity tenderness.  No edema. Neurologic:   Normal speech and language.  Motor grossly intact. No acute focal neurologic deficits are appreciated.    ____________________________________________    LABS (pertinent positives/negatives) (all labs ordered are listed, but only abnormal results are displayed) Labs Reviewed  COMPREHENSIVE METABOLIC PANEL - Abnormal; Notable for the following components:      Result Value   Potassium 3.2 (*)    Total Protein 8.6 (*)    ALT 12 (*)    All other components within normal limits  ACETAMINOPHEN LEVEL - Abnormal; Notable for the following components:   Acetaminophen  (Tylenol), Serum <10 (*)    All other components within normal limits  CBC - Abnormal; Notable for the following components:   RBC 5.53 (*)    MCV 72.4 (*)    MCH 22.5 (*)    MCHC 31.0 (*)    RDW 19.6 (*)    Platelets 525 (*)    All other components within normal limits  ETHANOL  SALICYLATE LEVEL  URINE DRUG SCREEN, QUALITATIVE ([facility] ONLY)  POC URINE PREG, ED   ____________________________________________   EKG    ____________________________________________    RADIOLOGY  No results found.  ____________________________________________   PROCEDURES Procedures  ____________________________________________    CLINICAL IMPRESSION / ASSESSMENT AND PLAN / ED COURSE  Pertinent labs & imaging results that were available during my care of the patient were  reviewed by me and considered in my medical decision making (see chart for details).   Patient well appearing no acute distress, unremarkable vital signs except for hypertension. Patient reports that this is baseline for her despite her losartan use. Symptoms are concerning for major depression. Psychiatry consult obtained, recommended Vistaril and sertraline. All start this with a limited prescription while the patient arranges follow-up. Not a danger to herself or others, doesn't meet commitment criteria, suitable for outpatient follow-up. Follow-up with her doctor regarding hypertension management. No evidence of end organ damage, not dangerously elevated at this time. No evidence of substance abuse. Labs unremarkable.      ____________________________________________   FINAL CLINICAL IMPRESSION(S) / ED DIAGNOSES    Final diagnoses:  Moderate episode of recurrent major depressive disorder [facility])  Essential hypertension     ED Discharge Orders        Ordered    hydrOXYzine (VISTARIL) 25 MG capsule  At bedtime PRN     08/25/17 1814    sertraline (ZOLOFT) 25 MG tablet  Daily     08/25/17 1814       Portions of this note were generated with dragon dictation software. Dictation errors may occur despite best attempts at proofreading.    Sharman Cheek, MD 08/25/17 1820

## 2018-03-09 ENCOUNTER — Encounter: Payer: Self-pay | Admitting: Emergency Medicine

## 2018-03-09 ENCOUNTER — Emergency Department
Admission: EM | Admit: 2018-03-09 | Discharge: 2018-03-09 | Disposition: A | Discharge status: Home / Self Care | Payer: Self-pay | Attending: Emergency Medicine | Admitting: Emergency Medicine

## 2018-03-09 DIAGNOSIS — F17210 Nicotine dependence, cigarettes, uncomplicated: Secondary | ICD-10-CM | POA: Insufficient documentation

## 2018-03-09 DIAGNOSIS — R110 Nausea: Secondary | ICD-10-CM | POA: Insufficient documentation

## 2018-03-09 DIAGNOSIS — L03115 Cellulitis of right lower limb: Secondary | ICD-10-CM | POA: Insufficient documentation

## 2018-03-09 DIAGNOSIS — Z79899 Other long term (current) drug therapy: Secondary | ICD-10-CM | POA: Insufficient documentation

## 2018-03-09 DIAGNOSIS — I10 Essential (primary) hypertension: Secondary | ICD-10-CM | POA: Insufficient documentation

## 2018-03-09 LAB — CBC
HEMATOCRIT: 36.6 % (ref 35.0–47.0)
HEMOGLOBIN: 11.8 g/dL — AB (ref 12.0–16.0)
MCH: 23.8 pg — ABNORMAL LOW (ref 26.0–34.0)
MCHC: 32.1 g/dL (ref 32.0–36.0)
MCV: 74 fL — ABNORMAL LOW (ref 80.0–100.0)
Platelets: 475 10*3/uL — ABNORMAL HIGH (ref 150–440)
RBC: 4.94 MIL/uL (ref 3.80–5.20)
RDW: 20.2 % — ABNORMAL HIGH (ref 11.5–14.5)
WBC: 11.5 10*3/uL — AB (ref 3.6–11.0)

## 2018-03-09 LAB — URINALYSIS, COMPLETE (UACMP) WITH MICROSCOPIC
BACTERIA UA: NONE SEEN
BILIRUBIN URINE: NEGATIVE
Glucose, UA: NEGATIVE mg/dL
Ketones, ur: NEGATIVE mg/dL
LEUKOCYTES UA: NEGATIVE
NITRITE: NEGATIVE
Protein, ur: NEGATIVE mg/dL
SPECIFIC GRAVITY, URINE: 1.021 (ref 1.005–1.030)
pH: 7 (ref 5.0–8.0)

## 2018-03-09 LAB — COMPREHENSIVE METABOLIC PANEL
ALT: 14 U/L (ref 0–44)
ANION GAP: 7 (ref 5–15)
AST: 18 U/L (ref 15–41)
Albumin: 4 g/dL (ref 3.5–5.0)
Alkaline Phosphatase: 78 U/L (ref 38–126)
BUN: 15 mg/dL (ref 6–20)
CO2: 27 mmol/L (ref 22–32)
Calcium: 9.3 mg/dL (ref 8.9–10.3)
Chloride: 103 mmol/L (ref 98–111)
Creatinine, Ser: 0.63 mg/dL (ref 0.44–1.00)
Glucose, Bld: 93 mg/dL (ref 70–99)
POTASSIUM: 3.8 mmol/L (ref 3.5–5.1)
Sodium: 137 mmol/L (ref 135–145)
TOTAL PROTEIN: 7.8 g/dL (ref 6.5–8.1)
Total Bilirubin: 0.5 mg/dL (ref 0.3–1.2)

## 2018-03-09 LAB — POCT PREGNANCY, URINE: Preg Test, Ur: NEGATIVE

## 2018-03-09 MED ORDER — PROMETHAZINE HCL 25 MG PO TABS: 25.0000 mg | ORAL_TABLET | Freq: Four times a day (QID) | ORAL | 0 refills | Status: DC | PRN

## 2018-03-09 MED ORDER — SULFAMETHOXAZOLE-TRIMETHOPRIM 800-160 MG PO TABS: 1.0000 | ORAL_TABLET | Freq: Two times a day (BID) | ORAL | 0 refills | Status: AC

## 2018-03-09 MED ORDER — SULFAMETHOXAZOLE-TRIMETHOPRIM 800-160 MG PO TABS
1.0000 | ORAL_TABLET | Freq: Once | ORAL | Status: AC
  Administered 2018-03-09: 1 via ORAL
  Filled 2018-03-09: qty 1

## 2018-03-09 NOTE — ED Notes (Addendum)
NAD noted at time of D/C. Pt denies questions or concerns. Pt ambulatory to the lobby at this time. Pt instructed to take her home BP meds when she gets home as she endorses not taking her BP meds today.

## 2018-03-09 NOTE — ED Notes (Signed)
Pt presents to ED with c/o hx of frequent UTI. Pt states for the last 3 weeks has had abdominal pain, fatigue, HA, and dysuria. Pt is neurologically intact. Initially playing on cell phone during assessment.

## 2018-03-09 NOTE — ED Provider Notes (Signed)
[facility] Emergency Department Provider Note  Time seen: 8:46 PM  I have reviewed the triage vital signs and the nursing notes.   HISTORY  Chief Complaint Abdominal Pain and Nausea    HPI Maria Tapia is a 32 y.o. female with a past medical history of hypertension, strabismus, presents to the emergency department for nausea, right foot pain, abdominal discomfort.  According to the patient for the past several days she has been experiencing abdominal cramping type pain and nausea denies any vomiting or diarrhea.  Denies any hematuria or dysuria but states she has noticed some cloudy urination which she has had with urinary tract infections previously.  She also states for the past several weeks she has had pain to the bottom of her right foot.  Describes abdominal cramping is minimal, pain to the right foot is moderate worse with ambulation.   Past Medical History:  Diagnosis Date  . Hypertension   . Lazy eye of right side   . Scoliosis     Patient Active Problem List   Diagnosis Date Noted  . Suicidal ideation 08/10/2015  . Depression, major, recurrent, moderate (HCC) 08/10/2015    Past Surgical History:  Procedure Laterality Date  . c-cection     . EYE SURGERY    . PILONIDAL CYST EXCISION    . REFRACTIVE SURGERY      Prior to Admission medications   Medication Sig Start Date End Date Taking? Authorizing Provider  benzonatate (TESSALON PERLES) 100 MG capsule Take 2 capsules (200 mg total) by mouth 3 (three) times daily as needed for cough. 04/24/17 04/24/18  Joni ReiningSmith, Ronald K, PA-C  citalopram (CELEXA) 20 MG tablet Take 1 tablet (20 mg total) by mouth daily. Patient not taking: Reported on 04/01/2017 08/10/15   Clapacs, Jackquline DenmarkJohn T, MD  fexofenadine-pseudoephedrine (ALLEGRA-D) 60-120 MG 12 hr tablet Take 1 tablet by mouth 2 (two) times daily. 04/24/17   Joni ReiningSmith, Ronald K, PA-C  fluconazole (DIFLUCAN) 150 MG tablet Take 1 tablet (150 mg total) by mouth  daily. Patient not taking: Reported on 04/01/2017 12/29/15   Evangeline DakinBeers, Charles M, PA-C  hydrOXYzine (VISTARIL) 25 MG capsule Take 1 capsule (25 mg total) by mouth at bedtime as needed. 08/25/17   Sharman CheekStafford, Phillip, MD  ibuprofen (ADVIL,MOTRIN) 600 MG tablet Take 1 tablet (600 mg total) by mouth every 8 (eight) hours as needed. 04/24/17   Joni ReiningSmith, Ronald K, PA-C  ibuprofen (ADVIL,MOTRIN) 800 MG tablet Take 1 tablet (800 mg total) by mouth every 8 (eight) hours as needed. Patient not taking: Reported on 04/01/2017 11/15/14   Evangeline DakinBeers, Charles M, PA-C  labetalol (NORMODYNE) 100 MG tablet Take 100 mg by mouth daily.    [provider]  losartan (COZAAR) 25 MG tablet Take 25 mg by mouth 2 (two) times daily.    [provider]  medroxyPROGESTERone (PROVERA) 10 MG tablet Take 2 tablets (20 mg total) by mouth daily. Patient not taking: Reported on 04/01/2017 03/21/16   Sharman CheekStafford, Phillip, MD  metoCLOPramide (REGLAN) 10 MG tablet Take 1 tablet (10 mg total) by mouth every 6 (six) hours as needed for nausea or vomiting. Patient not taking: Reported on 04/01/2017 11/24/14   Myrna BlazerSchaevitz, David Matthew, MD  phenazopyridine (PYRIDIUM) 200 MG tablet Take 1 tablet (200 mg total) by mouth 3 (three) times daily as needed for pain. Patient not taking: Reported on 04/01/2017 12/29/15   Evangeline DakinBeers, Charles M, PA-C  sertraline (ZOLOFT) 25 MG tablet Take 1 tablet (25 mg total) by mouth daily.  08/25/17 08/25/18  Sharman Cheek, MD    Allergies  Allergen Reactions  . Penicillins Anaphylaxis    Has patient had a PCN reaction causing immediate rash, facial/tongue/throat swelling, SOB or lightheadedness with hypotension: Yes Has patient had a PCN reaction causing severe rash involving mucus membranes or skin necrosis: No Has patient had a PCN reaction that required hospitalization: No Has patient had a PCN reaction occurring within the last 10 years: No If all of the above answers are "NO", then may proceed with Cephalosporin use.    Marland Kitchen Amoxicillin Hives    No family history on file.  Social History Social History   Tobacco Use  . Smoking status: Current Every Day Smoker    Packs/day: 1.00    Types: Cigarettes  . Smokeless tobacco: Never Used  Substance Use Topics  . Alcohol use: No  . Drug use: No    Review of Systems Constitutional: Negative for fever. Cardiovascular: Negative for chest pain. Respiratory: Negative for shortness of breath. Gastrointestinal: Mild abdominal discomfort.  Positive for nausea but negative for vomiting or diarrhea. Genitourinary: Negative for urinary compaints Musculoskeletal: Pain to bottom of right foot. Skin: Negative for skin complaints  Neurological: Negative for headache All other ROS negative  ____________________________________________   PHYSICAL EXAM:  VITAL SIGNS: ED Triage Vitals [03/09/18 1956]  Enc Vitals Group     BP (!) 183/112     Pulse Rate 95     Resp 18     Temp 98.6 F (37 C)     Temp Source Oral     SpO2 100 %     Weight 210 lb (95.3 kg)     Height 5' (1.524 m)     Head Circumference      Peak Flow      Pain Score 10     Pain Loc      Pain Edu?      Excl. in GC?    Constitutional: Alert and oriented. Well appearing and in no distress. Eyes: Normal exam ENT   Head: Normocephalic and atraumatic.   Mouth/Throat: Mucous membranes are moist. Cardiovascular: Normal rate, regular rhythm. No murmur Respiratory: Normal respiratory effort without tachypnea nor retractions. Breath sounds are clear Gastrointestinal: Soft and nontender. No distention.  Musculoskeletal: Patient does have a small area of swelling and tenderness to the distal aspect of the plantar right foot.  Possibly consistent with small infection.  No significant surrounding erythema swelling.  No lymphatic streaking. Neurologic:  Normal speech and language. No gross focal neurologic deficits Skin:  Skin is warm, dry and intact.  Psychiatric: Mood and affect are normal.       INITIAL IMPRESSION / ASSESSMENT AND PLAN / ED COURSE  Pertinent labs & imaging results that were available during my care of the patient were reviewed by me and considered in my medical decision making (see chart for details).  Patient presents to the emergency department for several days of abdominal cramping and nausea as well as right foot pain for the past 2 weeks.  Differentials for the abdomen would include infectious etiology, gastroenteritis, gallbladder disease, colitis, diverticulitis.  Reassuringly patient has a completely benign abdominal exam.  Urinalysis has resulted largely normal as well small amount of blood within the urine.  On the bottom of the patient's right foot she does have a small area approximately 1.5 cm in diameter that is tender and somewhat raised consistent with possible infection.  We will cover with antibiotics as a precaution to cover  for possible cellulitis.  We will treat with nausea medication.  Patient reassuringly has a completely benign abdominal exam.  I discussed with the patient need to follow-up with her primary care doctor.  She states she does not have any money to see a doctor.  ____________________________________________   FINAL CLINICAL IMPRESSION(S) / ED DIAGNOSES  Nausea Cellulitis    Minna Antis, MD 03/09/18 2049

## 2018-03-09 NOTE — ED Triage Notes (Addendum)
Patient with complaint of increased urinary frequency, lower abdominal pain and nausea. Patient states that she has not taken her blood pressure today. Patient states that she thinks that she has a UTI.

## 2018-04-14 IMAGING — CR DG CHEST 2V
1 series · 2 of 2 positions shown · non-contrast
Comparison: Chest x-ray of April 01, 2017

CLINICAL DATA: One week of URI symptoms including chest congestion
and cough.

EXAM:
CHEST  2 VIEW

[Series 1: dg chest 2 view · 0.14mm/px · 2 of 2 slices shown]
[im 1/2]
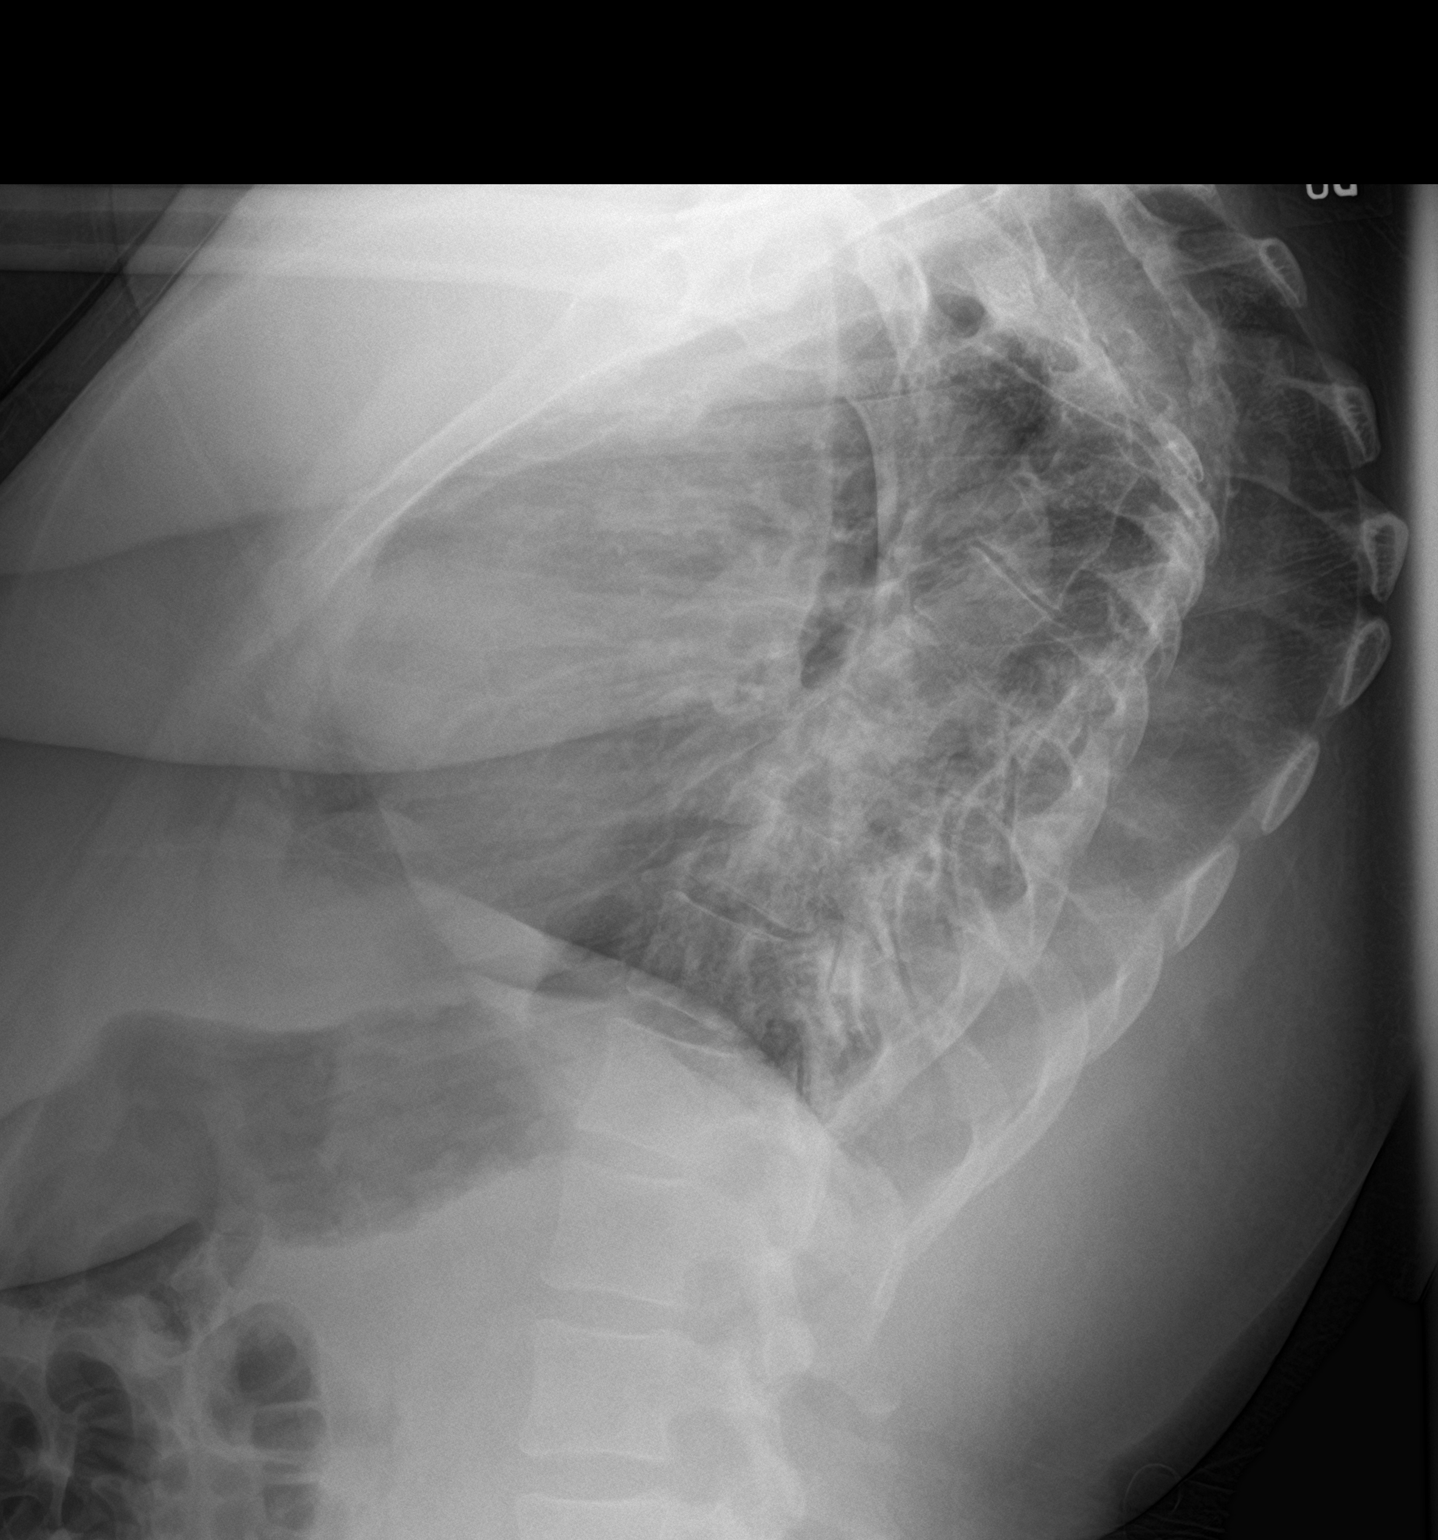
[im 2/2]
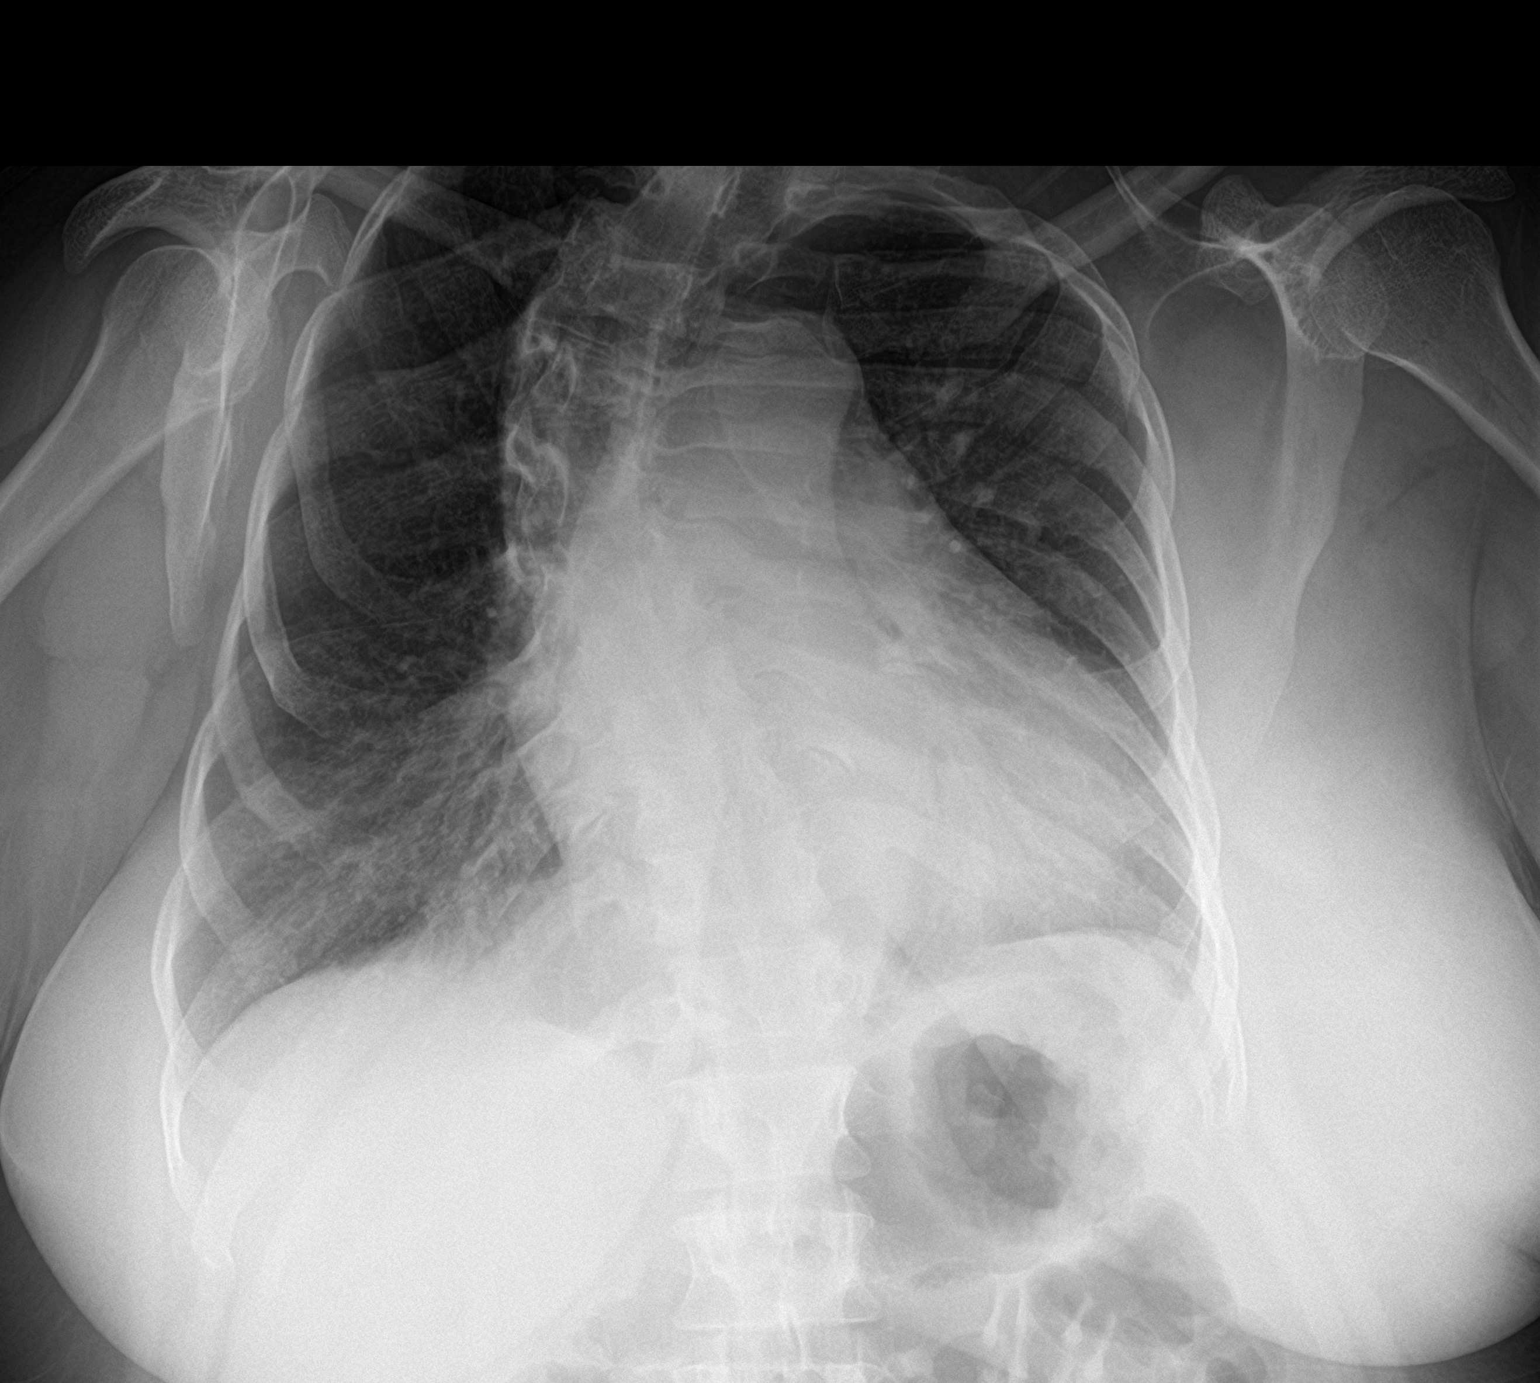

[2 of 2 positions shown; findings below may reference images not displayed]

FINDINGS: The lungs are well-expanded. There is no focal infiltrate. There is
no pleural effusion. The heart is top-normal in size but stable. The
pulmonary vascularity is mildly prominent centrally. There is
moderate to severe dextrocurvature centered in the mid upper
thoracic spine.
IMPRESSION: There is no acute pneumonia. Stable cardiomegaly without pulmonary
vascular congestion or pulmonary edema.

## 2018-04-28 ENCOUNTER — Emergency Department: Payer: Self-pay

## 2018-04-28 ENCOUNTER — Other Ambulatory Visit: Payer: Self-pay

## 2018-04-28 ENCOUNTER — Emergency Department
Admission: EM | Admit: 2018-04-28 | Discharge: 2018-04-28 | Disposition: A | Discharge status: Home / Self Care | Payer: Self-pay | Attending: Emergency Medicine | Admitting: Emergency Medicine

## 2018-04-28 DIAGNOSIS — Z5321 Procedure and treatment not carried out due to patient leaving prior to being seen by health care provider: Secondary | ICD-10-CM | POA: Insufficient documentation

## 2018-04-28 DIAGNOSIS — M79671 Pain in right foot: Secondary | ICD-10-CM | POA: Insufficient documentation

## 2018-04-28 NOTE — ED Triage Notes (Signed)
Patient reports right foot pain "for awhile, I think I stepped on something."

## 2018-04-29 ENCOUNTER — Emergency Department
Admission: EM | Admit: 2018-04-29 | Discharge: 2018-04-29 | Disposition: A | Discharge status: Home / Self Care | Payer: Self-pay | Attending: Emergency Medicine | Admitting: Emergency Medicine

## 2018-04-29 ENCOUNTER — Other Ambulatory Visit: Payer: Self-pay

## 2018-04-29 ENCOUNTER — Encounter: Payer: Self-pay | Admitting: Emergency Medicine

## 2018-04-29 DIAGNOSIS — L84 Corns and callosities: Secondary | ICD-10-CM | POA: Insufficient documentation

## 2018-04-29 DIAGNOSIS — F17210 Nicotine dependence, cigarettes, uncomplicated: Secondary | ICD-10-CM | POA: Insufficient documentation

## 2018-04-29 DIAGNOSIS — I10 Essential (primary) hypertension: Secondary | ICD-10-CM | POA: Insufficient documentation

## 2018-04-29 DIAGNOSIS — Z79899 Other long term (current) drug therapy: Secondary | ICD-10-CM | POA: Insufficient documentation

## 2018-04-29 MED ORDER — TRAMADOL HCL 50 MG PO TABS: 50.0000 mg | ORAL_TABLET | Freq: Four times a day (QID) | ORAL | 0 refills | Status: AC | PRN

## 2018-04-29 MED ORDER — LIDOCAINE VISCOUS HCL 2 % MT SOLN
15.0000 mL | Freq: Once | OROMUCOSAL | Status: AC
  Administered 2018-04-29: 15 mL via OROMUCOSAL
  Filled 2018-04-29: qty 15

## 2018-04-29 MED ORDER — TRAMADOL HCL 50 MG PO TABS
100.0000 mg | ORAL_TABLET | Freq: Once | ORAL | Status: AC
  Administered 2018-04-29: 100 mg via ORAL
  Filled 2018-04-29: qty 2

## 2018-04-29 MED ORDER — DOXYCYCLINE HYCLATE 100 MG PO CAPS: 100.0000 mg | ORAL_CAPSULE | Freq: Two times a day (BID) | ORAL | 0 refills | Status: AC

## 2018-04-29 MED ORDER — DOXYCYCLINE HYCLATE 100 MG PO TABS
100.0000 mg | ORAL_TABLET | Freq: Once | ORAL | Status: AC
  Administered 2018-04-29: 100 mg via ORAL
  Filled 2018-04-29: qty 1

## 2018-04-29 MED ORDER — PENTAFLUOROPROP-TETRAFLUOROETH EX AERO
INHALATION_SPRAY | CUTANEOUS | Status: DC | PRN
  Administered 2018-04-29: 30 via TOPICAL
  Filled 2018-04-29: qty 30

## 2018-04-29 NOTE — ED Triage Notes (Signed)
Pt to triage via w/c with no distress noted; pt reports ?puncture or callous to bottom of right foot x 6months; pt was here yesterday but left prior to being seen due to long wait

## 2018-04-29 NOTE — ED Provider Notes (Signed)
[facility] Emergency Department Provider Note ________________   First MD Initiated Contact with Patient 04/29/18 0340     (approximate)  I have reviewed the triage vital signs and the nursing notes.   HISTORY  Chief Complaint Foot Pain    HPI Maria Tapia is a 32 y.o. female presents to the emergency department with a painful area on the plantar aspect of her right foot x6 months.  Patient states that she noted a callus in the area but that it is become progressively more painful.  Patient states current pain score 7 out of 10 and worse with any ambulation.  Patient denies any fever.  Patient denies any drainage from the area.  Past Medical History:  Diagnosis Date  . Hypertension   . Lazy eye of right side   . Scoliosis     Patient Active Problem List   Diagnosis Date Noted  . Suicidal ideation 08/10/2015  . Depression, major, recurrent, moderate (HCC) 08/10/2015    Past Surgical History:  Procedure Laterality Date  . c-cection     . EYE SURGERY    . PILONIDAL CYST EXCISION    . REFRACTIVE SURGERY      Prior to Admission medications   Medication Sig Start Date End Date Taking? Authorizing Provider  citalopram (CELEXA) 20 MG tablet Take 1 tablet (20 mg total) by mouth daily. Patient not taking: Reported on 04/01/2017 08/10/15   Clapacs, Jackquline Denmark, MD  doxycycline (VIBRAMYCIN) 100 MG capsule Take 1 capsule (100 mg total) by mouth 2 (two) times daily for 10 days. 04/29/18 05/09/18  Darci Current, MD  fexofenadine-pseudoephedrine (ALLEGRA-D) 60-120 MG 12 hr tablet Take 1 tablet by mouth 2 (two) times daily. 04/24/17   Joni Reining, PA-C  fluconazole (DIFLUCAN) 150 MG tablet Take 1 tablet (150 mg total) by mouth daily. Patient not taking: Reported on 04/01/2017 12/29/15   Evangeline Dakin, PA-C  hydrOXYzine (VISTARIL) 25 MG capsule Take 1 capsule (25 mg total) by mouth at bedtime as needed. 08/25/17   Sharman Cheek, MD  ibuprofen  (ADVIL,MOTRIN) 600 MG tablet Take 1 tablet (600 mg total) by mouth every 8 (eight) hours as needed. 04/24/17   Joni Reining, PA-C  ibuprofen (ADVIL,MOTRIN) 800 MG tablet Take 1 tablet (800 mg total) by mouth every 8 (eight) hours as needed. Patient not taking: Reported on 04/01/2017 11/15/14   Evangeline Dakin, PA-C  labetalol (NORMODYNE) 100 MG tablet Take 100 mg by mouth daily.    [provider]  losartan (COZAAR) 25 MG tablet Take 25 mg by mouth 2 (two) times daily.    [provider]  medroxyPROGESTERone (PROVERA) 10 MG tablet Take 2 tablets (20 mg total) by mouth daily. Patient not taking: Reported on 04/01/2017 03/21/16   Sharman Cheek, MD  metoCLOPramide (REGLAN) 10 MG tablet Take 1 tablet (10 mg total) by mouth every 6 (six) hours as needed for nausea or vomiting. Patient not taking: Reported on 04/01/2017 11/24/14   Myrna Blazer, MD  phenazopyridine (PYRIDIUM) 200 MG tablet Take 1 tablet (200 mg total) by mouth 3 (three) times daily as needed for pain. Patient not taking: Reported on 04/01/2017 12/29/15   Beers, Charmayne Sheer, PA-C  promethazine (PHENERGAN) 25 MG tablet Take 1 tablet (25 mg total) by mouth every 6 (six) hours as needed for nausea or vomiting. 03/09/18   Minna Antis, MD  sertraline (ZOLOFT) 25 MG tablet Take 1 tablet (25 mg total) by mouth daily. 08/25/17  08/25/18  Sharman Cheek, MD  traMADol (ULTRAM) 50 MG tablet Take 1 tablet (50 mg total) by mouth every 6 (six) hours as needed. 04/29/18 04/29/19  Darci Current, MD    Allergies Penicillins and Amoxicillin  No family history on file.  Social History Social History   Tobacco Use  . Smoking status: Current Every Day Smoker    Packs/day: 1.00    Types: Cigarettes  . Smokeless tobacco: Never Used  Substance Use Topics  . Alcohol use: No  . Drug use: No    Review of Systems Constitutional: No fever/chills Eyes: No visual changes. ENT: No sore throat. Cardiovascular: Denies  chest pain. Respiratory: Denies shortness of breath. Gastrointestinal: No abdominal pain.  No nausea, no vomiting.  No diarrhea.  No constipation. Genitourinary: Negative for dysuria. Musculoskeletal: Negative for neck pain.  Negative for back pain. Integumentary: Negative for rash.  Painful area on the plantar aspect of the right foot Neurological: Negative for headaches, focal weakness or numbness.   ____________________________________________   PHYSICAL EXAM:  VITAL SIGNS: ED Triage Vitals [04/29/18 0213]  Enc Vitals Group     BP (!) 158/110     Pulse Rate 86     Resp 20     Temp 97.8 F (36.6 C)     Temp Source Oral     SpO2 100 %     Weight 89.8 kg (198 lb)     Height 1.524 m (5')     Head Circumference      Peak Flow      Pain Score 10     Pain Loc      Pain Edu?      Excl. in GC?     Constitutional: Alert and oriented. Well appearing and in no acute distress. Eyes: Conjunctivae are normal.  Head: Atraumatic. Mouth/Throat: Mucous membranes are moist. Neck: No stridor.   Cardiovascular: Normal rate, regular rhythm. Good peripheral circulation. Grossly normal heart sounds. Musculoskeletal: No lower extremity tenderness nor edema. No gross deformities of extremities. Neurologic:  Normal speech and language. No gross focal neurologic deficits are appreciated.  Skin: Callus noted on the plantar aspect of the right foot just inferior to the fourth toe no signs of infection.  Area is tender to the touch.  Psychiatric: Mood and affect are normal. Speech and behavior are normal.  ____________________________________________      Procedures   ____________________________________________   INITIAL IMPRESSION / ASSESSMENT AND PLAN / ED COURSE  As part of my medical decision making, I reviewed the following data within the electronic MEDICAL RECORD NUMBER   She presented to the emergency department with above-stated history and physical exam secondary to callus on the  plantar aspect of the right foot.  Patient did present to the emergency department yesterday however left before being seen.  X-ray was performed which revealed no evidence of foreign body. ____________________________________________  FINAL CLINICAL IMPRESSION(S) / ED DIAGNOSES  Final diagnoses:  Callus of foot     MEDICATIONS GIVEN DURING THIS VISIT:  Medications  traMADol (ULTRAM) tablet 100 mg (100 mg Oral Given 04/29/18 0526)  lidocaine (XYLOCAINE) 2 % viscous mouth solution 15 mL (15 mLs Mouth/Throat Given 04/29/18 0526)  doxycycline (VIBRA-TABS) tablet 100 mg (100 mg Oral Given 04/29/18 0525)     ED Discharge Orders         Ordered    traMADol (ULTRAM) 50 MG tablet  Every 6 hours PRN     04/29/18 0514    doxycycline (VIBRAMYCIN)  100 MG capsule  2 times daily     04/29/18 1610           Note:  This document was prepared using Dragon voice recognition software and may include unintentional dictation errors.    Darci Current, MD 04/29/18 2227

## 2018-07-30 ENCOUNTER — Emergency Department
Admission: EM | Admit: 2018-07-30 | Discharge: 2018-07-30 | Disposition: A | Discharge status: Home / Self Care | Payer: Self-pay | Attending: Emergency Medicine | Admitting: Emergency Medicine

## 2018-07-30 ENCOUNTER — Other Ambulatory Visit: Payer: Self-pay

## 2018-07-30 ENCOUNTER — Emergency Department: Payer: Self-pay

## 2018-07-30 DIAGNOSIS — F17210 Nicotine dependence, cigarettes, uncomplicated: Secondary | ICD-10-CM | POA: Insufficient documentation

## 2018-07-30 DIAGNOSIS — R69 Illness, unspecified: Secondary | ICD-10-CM

## 2018-07-30 DIAGNOSIS — J111 Influenza due to unidentified influenza virus with other respiratory manifestations: Secondary | ICD-10-CM | POA: Insufficient documentation

## 2018-07-30 DIAGNOSIS — I10 Essential (primary) hypertension: Secondary | ICD-10-CM | POA: Insufficient documentation

## 2018-07-30 LAB — URINALYSIS, COMPLETE (UACMP) WITH MICROSCOPIC
BACTERIA UA: NONE SEEN
Bilirubin Urine: NEGATIVE
Glucose, UA: NEGATIVE mg/dL
Ketones, ur: 5 mg/dL — AB
Leukocytes, UA: NEGATIVE
NITRITE: NEGATIVE
PROTEIN: NEGATIVE mg/dL
RBC / HPF: >50 RBC/hpf — ABNORMAL HIGH (ref 0–5)
Specific Gravity, Urine: 1.015 (ref 1.005–1.030)
pH: 8 (ref 5.0–8.0)

## 2018-07-30 LAB — CBC
HEMATOCRIT: 34.8 % — AB (ref 36.0–46.0)
HEMOGLOBIN: 10.6 g/dL — AB (ref 12.0–15.0)
MCH: 22.8 pg — AB (ref 26.0–34.0)
MCHC: 30.5 g/dL (ref 30.0–36.0)
MCV: 75 fL — ABNORMAL LOW (ref 80.0–100.0)
Platelets: 499 10*3/uL — ABNORMAL HIGH (ref 150–400)
RBC: 4.64 MIL/uL (ref 3.87–5.11)
RDW: 17.8 % — ABNORMAL HIGH (ref 11.5–15.5)
WBC: 6.6 10*3/uL (ref 4.0–10.5)
nRBC: 0 % (ref 0.0–0.2)

## 2018-07-30 LAB — COMPREHENSIVE METABOLIC PANEL
ALK PHOS: 69 U/L (ref 38–126)
ALT: 11 U/L (ref 0–44)
AST: 17 U/L (ref 15–41)
Albumin: 4.1 g/dL (ref 3.5–5.0)
Anion gap: 6 (ref 5–15)
BUN: 8 mg/dL (ref 6–20)
CALCIUM: 8.5 mg/dL — AB (ref 8.9–10.3)
CO2: 27 mmol/L (ref 22–32)
CREATININE: 0.56 mg/dL (ref 0.44–1.00)
Chloride: 100 mmol/L (ref 98–111)
GFR calc non Af Amer: >60 mL/min (ref >60)
GLUCOSE: 93 mg/dL (ref 70–99)
Potassium: 3.3 mmol/L — ABNORMAL LOW (ref 3.5–5.1)
SODIUM: 133 mmol/L — AB (ref 135–145)
Total Bilirubin: 0.6 mg/dL (ref 0.3–1.2)
Total Protein: 7.5 g/dL (ref 6.5–8.1)

## 2018-07-30 MED ORDER — LOSARTAN POTASSIUM 25 MG PO TABS
25.0000 mg | ORAL_TABLET | Freq: Once | ORAL | Status: DC
  Filled 2018-07-30: qty 1

## 2018-07-30 MED ORDER — ONDANSETRON 4 MG PO TBDP: 4.0000 mg | ORAL_TABLET | Freq: Three times a day (TID) | ORAL | 0 refills | Status: DC | PRN

## 2018-07-30 MED ORDER — ACETAMINOPHEN 500 MG PO TABS
1000.0000 mg | ORAL_TABLET | Freq: Once | ORAL | Status: AC
  Administered 2018-07-30: 1000 mg via ORAL
  Filled 2018-07-30: qty 2

## 2018-07-30 MED ORDER — KETOROLAC TROMETHAMINE 30 MG/ML IJ SOLN
30.0000 mg | Freq: Once | INTRAMUSCULAR | Status: AC
  Administered 2018-07-30: 30 mg via INTRAVENOUS
  Filled 2018-07-30: qty 1

## 2018-07-30 MED ORDER — LABETALOL HCL 5 MG/ML IV SOLN
10.0000 mg | Freq: Once | INTRAVENOUS | Status: AC
  Administered 2018-07-30: 10 mg via INTRAVENOUS
  Filled 2018-07-30: qty 4

## 2018-07-30 MED ORDER — LABETALOL HCL 100 MG PO TABS
100.0000 mg | ORAL_TABLET | Freq: Once | ORAL | Status: DC
  Filled 2018-07-30: qty 1

## 2018-07-30 MED ORDER — ONDANSETRON 4 MG PO TBDP: 4.0000 mg | ORAL_TABLET | Freq: Once | ORAL | Status: DC

## 2018-07-30 MED ORDER — SODIUM CHLORIDE 0.9 % IV BOLUS
1000.0000 mL | Freq: Once | INTRAVENOUS | Status: AC
  Administered 2018-07-30: 1000 mL via INTRAVENOUS

## 2018-07-30 MED ORDER — ONDANSETRON HCL 4 MG/2ML IJ SOLN
4.0000 mg | Freq: Once | INTRAMUSCULAR | Status: AC
  Administered 2018-07-30: 4 mg via INTRAVENOUS
  Filled 2018-07-30: qty 2

## 2018-07-30 NOTE — Discharge Instructions (Signed)
Please drink plenty of fluid to stay well-hydrated.  Please take a clear liquid diet for the next 24 hours, then advance to a bland diet as tolerated.  Please take Tylenol and Motrin as needed for body aches, pain, and fever.  Return to the emergency department if you develop severe pain, lightheadedness or fainting, fever that is not controlled by medication, shortness of breath, or any other symptoms concerning to you.  Please wash your hands frequently and thoroughly to prevent the spread of infection.

## 2018-07-30 NOTE — ED Notes (Signed)
ED Provider at bedside. 

## 2018-07-30 NOTE — ED Triage Notes (Signed)
Pt comes via POV from work with flu like symptoms. Pt states this started yesterday.  Pt states chills and vomiting.  Pt states generalized body aches all over.

## 2018-07-30 NOTE — ED Provider Notes (Signed)
[facility] Emergency Department Provider Note  ____________________________________________  Time seen: Approximately 6:35 PM  I have reviewed the triage vital signs and the nursing notes.   HISTORY  Chief Complaint flu symptoms    HPI Maria Tapia is a 33 y.o. female with a history of hypertension presenting with fever, dry cough, nausea and vomiting and myalgias.  The patient reports that her symptoms started yesterday and she has not had any further nausea and vomiting today but has not had anything by mouth, including her antihypertensive medications.  She has had no sick contacts or travel outside the [place].  She has not tried anything for symptoms.  LMP now.  Past Medical History:  Diagnosis Date  . Hypertension   . Lazy eye of right side   . Scoliosis     Patient Active Problem List   Diagnosis Date Noted  . Suicidal ideation 08/10/2015  . Depression, major, recurrent, moderate (HCC) 08/10/2015    Past Surgical History:  Procedure Laterality Date  . c-cection     . EYE SURGERY    . PILONIDAL CYST EXCISION    . REFRACTIVE SURGERY      Current Outpatient Rx  . Order #: [id number] Class: Print  . Order #: [id number] Class: Print  . Order #: [id number] Class: Print  . Order #: [id number] Class: Print  . Order #: [id number] Class: Print  . Order #: [id number] Class: Print  . Order #: [id number] Class: Historical Med  . Order #: [id number] Class: Historical Med  . Order #: [id number] Class: Print  . Order #: [id number] Class: Print  . Order #: [id number] Class: Print  . Order #: [id number] Class: Print  . Order #: [id number] Class: Print  . Order #: [id number] Class: Print  . Order #: [id number] Class: Print    Allergies Penicillins and Amoxicillin  No family history on file.  Social History Social History   Tobacco Use  . Smoking status: Current Every Day Smoker    Packs/day: 1.00    Types: Cigarettes  . Smokeless tobacco: Never Used   Substance Use Topics  . Alcohol use: No  . Drug use: No    Review of Systems Constitutional: Positive fever and chills..  As of general malaise and myalgias.  No lightheadedness or syncope. Eyes: No visual changes.  No eye discharge. ENT: No sore throat.  Positive congestion without rhinorrhea. Cardiovascular: Denies chest pain. Denies palpitations. Respiratory: Denies shortness of breath.  Positive dry cough. Gastrointestinal: No abdominal pain.  Positive nausea, positive vomiting.  No diarrhea.  No constipation. Genitourinary: Negative for dysuria.  LMP is now. Musculoskeletal: Negative for back pain. Skin: Negative for rash. Neurological: Negative for headaches. No focal numbness, tingling or weakness.     ____________________________________________   PHYSICAL EXAM:  VITAL SIGNS: ED Triage Vitals  Enc Vitals Group     BP 07/30/18 1721 (!) 183/120     Pulse Rate 07/30/18 1721 99     Resp 07/30/18 1721 18     Temp 07/30/18 1721 (!) 100.5 F (38.1 C)     Temp Source 07/30/18 1721 Oral     SpO2 07/30/18 1721 98 %     Weight 07/30/18 1719 220 lb (99.8 kg)     Height 07/30/18 1719 4\' 11"  (1.499 m)     Head Circumference --      Peak Flow --      Pain Score 07/30/18 1719 10     Pain Loc --      Pain Edu? --      Excl. in  GC? --     Constitutional: Alert and oriented. Answers questions appropriately.  Uncomfortable appearing and tearful. Eyes: Conjunctivae are normal.  The patient has a disconjugate gaze with some mild proptosis, which she states is chronic.Marland Kitchen No scleral icterus. Head: Atraumatic. Nose: Positive congestion without active rhinorrhea. Mouth/Throat: Mucous membranes are very dry.  Neck: No stridor.  Supple.  No meningismus. Cardiovascular: Normal rate, regular rhythm. No murmurs, rubs or gallops.  Respiratory: Normal respiratory effort.  No accessory muscle use or retractions. Lungs CTAB.  No wheezes, rales or ronchi. Gastrointestinal: Overweight.  Soft,  nontender and nondistended.  No guarding or rebound.  No peritoneal signs. Musculoskeletal: No LE edema.  Neurologic:  A&Ox3.  Speech is clear.  Face and smile are symmetric.  EOMI.  Moves all extremities well. Skin:  Skin is warm, dry and intact. No rash noted. Psychiatric: Mood and affect are normal.   ____________________________________________   LABS (all labs ordered are listed, but only abnormal results are displayed)  Labs Reviewed  CBC - Abnormal; Notable for the following components:      Result Value   Hemoglobin 10.6 (*)    HCT 34.8 (*)    MCV 75.0 (*)    MCH 22.8 (*)    RDW 17.8 (*)    Platelets 499 (*)    All other components within normal limits  COMPREHENSIVE METABOLIC PANEL - Abnormal; Notable for the following components:   Sodium 133 (*)    Potassium 3.3 (*)    Calcium 8.5 (*)    All other components within normal limits  URINALYSIS, COMPLETE (UACMP) WITH MICROSCOPIC - Abnormal; Notable for the following components:   Color, Urine YELLOW (*)    APPearance CLEAR (*)    Hgb urine dipstick MODERATE (*)    Ketones, ur 5 (*)    RBC / HPF >50 (*)    All other components within normal limits   ____________________________________________  EKG  Not indicated ____________________________________________  RADIOLOGY  Dg Chest 2 View  Result Date: 07/30/2018 CLINICAL DATA:  33 year old female with flu like symptoms since yesterday. EXAM: CHEST - 2 VIEW COMPARISON:  Chest radiographs 04/24/2017 and earlier FINDINGS: Chronic thoracic kyphoscoliosis. Stable visualized osseous structures. Stable cardiac size and mediastinal contours. No pneumothorax, pulmonary edema, pleural effusion or acute pulmonary opacity. Negative visible bowel gas pattern. IMPRESSION: 1.  No acute cardiopulmonary abnormality. 2. Chronic thoracic kyphoscoliosis. Electronically Signed   By: Odessa Fleming M.D.   On: 07/30/2018 18:57     ____________________________________________   PROCEDURES  Procedure(s) performed: None  Procedures  Critical Care performed: No ____________________________________________   INITIAL IMPRESSION / ASSESSMENT AND PLAN / ED COURSE  Pertinent labs & imaging results that were available during my care of the patient were reviewed by me and considered in my medical decision making (see chart for details).  33 y.o. female presenting with flulike symptoms.  Overall, the patient is febrile I will treat her with an anti-paretic.  She will also receive intravenous fluids and an antiemetic.  A UA and chest x-ray have been ordered to evaluate for other possible etiologies of the patient's symptoms.  Her abdominal examination is reassuring and I do not suspect a severe intra-abdominal infection or surgical pathology today.  The hospital currently has a flu swab shortage and she does not meet criteria for high risk patient so swab is deferred today.  She will be treated symptomatically.  Plan reevaluation for final disposition.  ----------------------------------------- 9:45 PM on 07/30/2018 -----------------------------------------  Patient's laboratory  studies are reassuring.  In addition, her blood pressure has improved since she was given medication and I have instructed her to take her home blood pressure medications when she returns home.  She has been able to drink liquids, and eat applesauce without vomiting.  She states she is feeling significantly better than upon arrival.  At this time, the patient safe to discharge home.  I have given her follow-up instructions, infection prevention strategies, a work note, and return precautions.  ____________________________________________  FINAL CLINICAL IMPRESSION(S) / ED DIAGNOSES  Final diagnoses:  Essential hypertension  Influenza-like illness         NEW MEDICATIONS STARTED DURING THIS VISIT:  New Prescriptions   ONDANSETRON  (ZOFRAN ODT) 4 MG DISINTEGRATING TABLET    Take 1 tablet (4 mg total) by mouth every 8 (eight) hours as needed.      Rockne MenghiniNorman, Anne-Caroline, MD 07/30/18 2146

## 2018-08-01 ENCOUNTER — Other Ambulatory Visit: Payer: Self-pay

## 2018-08-01 DIAGNOSIS — R509 Fever, unspecified: Secondary | ICD-10-CM | POA: Insufficient documentation

## 2018-08-01 DIAGNOSIS — Z5321 Procedure and treatment not carried out due to patient leaving prior to being seen by health care provider: Secondary | ICD-10-CM | POA: Insufficient documentation

## 2018-08-01 NOTE — ED Triage Notes (Signed)
Pt states she was diagnosed with flu this week. Pt states she continues to have fever. Pt states she needs "something" to help her feel better. Pt states last tylenol at 10 this am.

## 2018-08-02 ENCOUNTER — Emergency Department
Admission: EM | Admit: 2018-08-02 | Discharge: 2018-08-02 | Disposition: A | Discharge status: Home / Self Care | Payer: Self-pay | Attending: Emergency Medicine | Admitting: Emergency Medicine

## 2018-08-02 NOTE — ED Notes (Signed)
No answer when called from lobby x1 

## 2018-08-02 NOTE — ED Notes (Signed)
No answer when called from lobby x2. 

## 2018-08-02 NOTE — ED Notes (Signed)
No answer when called from lobby x3. 

## 2018-12-28 ENCOUNTER — Other Ambulatory Visit: Payer: Self-pay

## 2018-12-28 ENCOUNTER — Emergency Department
Admission: EM | Admit: 2018-12-28 | Discharge: 2018-12-28 | Disposition: A | Discharge status: Home / Self Care | Payer: HRSA Program | Attending: Emergency Medicine | Admitting: Emergency Medicine

## 2018-12-28 ENCOUNTER — Encounter: Payer: Self-pay | Admitting: Emergency Medicine

## 2018-12-28 DIAGNOSIS — R509 Fever, unspecified: Secondary | ICD-10-CM | POA: Insufficient documentation

## 2018-12-28 DIAGNOSIS — R309 Painful micturition, unspecified: Secondary | ICD-10-CM | POA: Diagnosis not present

## 2018-12-28 DIAGNOSIS — Z20828 Contact with and (suspected) exposure to other viral communicable diseases: Secondary | ICD-10-CM | POA: Diagnosis not present

## 2018-12-28 DIAGNOSIS — R300 Dysuria: Secondary | ICD-10-CM | POA: Diagnosis present

## 2018-12-28 DIAGNOSIS — R438 Other disturbances of smell and taste: Secondary | ICD-10-CM | POA: Diagnosis not present

## 2018-12-28 DIAGNOSIS — I10 Essential (primary) hypertension: Secondary | ICD-10-CM | POA: Insufficient documentation

## 2018-12-28 DIAGNOSIS — F17210 Nicotine dependence, cigarettes, uncomplicated: Secondary | ICD-10-CM | POA: Insufficient documentation

## 2018-12-28 DIAGNOSIS — N3090 Cystitis, unspecified without hematuria: Secondary | ICD-10-CM | POA: Diagnosis not present

## 2018-12-28 DIAGNOSIS — M545 Low back pain: Secondary | ICD-10-CM | POA: Diagnosis not present

## 2018-12-28 DIAGNOSIS — Z20822 Contact with and (suspected) exposure to covid-19: Secondary | ICD-10-CM

## 2018-12-28 LAB — URINALYSIS, COMPLETE (UACMP) WITH MICROSCOPIC
Bilirubin Urine: NEGATIVE
Glucose, UA: NEGATIVE mg/dL
Ketones, ur: NEGATIVE mg/dL
Leukocytes,Ua: NEGATIVE
Nitrite: NEGATIVE
Protein, ur: NEGATIVE mg/dL
Specific Gravity, Urine: 1.018 (ref 1.005–1.030)
pH: 6 (ref 5.0–8.0)

## 2018-12-28 MED ORDER — SULFAMETHOXAZOLE-TRIMETHOPRIM 800-160 MG PO TABS
1.0000 | ORAL_TABLET | Freq: Once | ORAL | Status: AC
  Administered 2018-12-28: 18:00:00 1 via ORAL
  Filled 2018-12-28: qty 1

## 2018-12-28 MED ORDER — SULFAMETHOXAZOLE-TRIMETHOPRIM 800-160 MG PO TABS: 1.0000 | ORAL_TABLET | Freq: Two times a day (BID) | ORAL | 0 refills | Status: DC

## 2018-12-28 NOTE — ED Notes (Signed)
See triage note. Pt states urination became very painful yesterday and she noted "a strong bleechy ammonia smell". She states that the urine was normal pale yellow in color. She took her own temp at home last night with a result of 100.5 degrees. Pt has self medicated with OTC ibuprofen.

## 2018-12-28 NOTE — Discharge Instructions (Signed)
Take the antibiotics as directed. Follow-up with [facility] for ongoing symptoms. You are being screened for COVID-19. You will notified via phone of your results in 24-48 hours. You are to remain quarantined at home, until results and further instructions are provided.

## 2018-12-28 NOTE — ED Triage Notes (Signed)
Pt to ED via POV c/o dysuria and fever. Pt reports that last night her temp was 100.5. pt states that her taste buds are off, states that she has not completely lost taste, things just don't taste right. Pt is in NAD.

## 2018-12-29 NOTE — ED Provider Notes (Signed)
[facility] Emergency Department Provider Note ____________________________________________  Time seen: [id number]  I have reviewed the triage vital signs and the nursing notes.  HISTORY  Chief Complaint  Dysuria and Fever  HPI Maria Tapia is a 33 y.o. female presents to the ED with a one-week complaint of dysuria and low-grade fevers.  She reports a temperature last night of 100.5 F.  She also reports that "her taste buds are off.".  She describes not really losing her sense of taste, but noting that things just do not taste right.  She does admit that her mother was diagnosed with coronavirus, about 2 weeks after the patient spent the weekend with her.  Patient denies any other symptoms at this time.  She is advised by her employer to present for evaluation and testing.  Patient denies any frank hematuria, urinary retention, abdominal pain, or nausea.  She does note pain to the perineum with urination and some mild low back pain.  Past Medical History:  Diagnosis Date  . Hypertension   . Lazy eye of right side   . Scoliosis     Patient Active Problem List   Diagnosis Date Noted  . Suicidal ideation 08/10/2015  . Depression, major, recurrent, moderate (HCC) 08/10/2015    Past Surgical History:  Procedure Laterality Date  . c-cection     . EYE SURGERY    . PILONIDAL CYST EXCISION    . REFRACTIVE SURGERY      Prior to Admission medications   Medication Sig Start Date End Date Taking? Authorizing Provider  citalopram (CELEXA) 20 MG tablet Take 1 tablet (20 mg total) by mouth daily. Patient not taking: Reported on 04/01/2017 08/10/15   Clapacs, Jackquline DenmarkJohn T, MD  fexofenadine-pseudoephedrine (ALLEGRA-D) 60-120 MG 12 hr tablet Take 1 tablet by mouth 2 (two) times daily. 04/24/17   Joni ReiningSmith, Ronald K, PA-C  fluconazole (DIFLUCAN) 150 MG tablet Take 1 tablet (150 mg total) by mouth daily. Patient not taking: Reported on 04/01/2017 12/29/15   Evangeline DakinBeers, Charles M, PA-C   hydrOXYzine (VISTARIL) 25 MG capsule Take 1 capsule (25 mg total) by mouth at bedtime as needed. 08/25/17   Sharman CheekStafford, Phillip, MD  ibuprofen (ADVIL,MOTRIN) 600 MG tablet Take 1 tablet (600 mg total) by mouth every 8 (eight) hours as needed. 04/24/17   Joni ReiningSmith, Ronald K, PA-C  ibuprofen (ADVIL,MOTRIN) 800 MG tablet Take 1 tablet (800 mg total) by mouth every 8 (eight) hours as needed. Patient not taking: Reported on 04/01/2017 11/15/14   Evangeline DakinBeers, Charles M, PA-C  labetalol (NORMODYNE) 100 MG tablet Take 100 mg by mouth daily.    [provider]  losartan (COZAAR) 25 MG tablet Take 25 mg by mouth 2 (two) times daily.    [provider]  medroxyPROGESTERone (PROVERA) 10 MG tablet Take 2 tablets (20 mg total) by mouth daily. Patient not taking: Reported on 04/01/2017 03/21/16   Sharman CheekStafford, Phillip, MD  metoCLOPramide (REGLAN) 10 MG tablet Take 1 tablet (10 mg total) by mouth every 6 (six) hours as needed for nausea or vomiting. Patient not taking: Reported on 04/01/2017 11/24/14   Myrna BlazerSchaevitz, David Matthew, MD  ondansetron (ZOFRAN ODT) 4 MG disintegrating tablet Take 1 tablet (4 mg total) by mouth every 8 (eight) hours as needed. 07/30/18   Rockne MenghiniNorman, Anne-Caroline, MD  phenazopyridine (PYRIDIUM) 200 MG tablet Take 1 tablet (200 mg total) by mouth 3 (three) times daily as needed for pain. Patient not taking: Reported on 04/01/2017 12/29/15   Evangeline DakinBeers, Charles M, PA-C  promethazine (PHENERGAN) 25 MG tablet Take 1 tablet (25 mg total) by mouth every 6 (six) hours as needed for nausea or vomiting. 03/09/18   Harvest Dark, MD  sertraline (ZOLOFT) 25 MG tablet Take 1 tablet (25 mg total) by mouth daily. 08/25/17 08/25/18  Carrie Mew, MD  sulfamethoxazole-trimethoprim (BACTRIM DS) 800-160 MG tablet Take 1 tablet by mouth 2 (two) times daily. 12/28/18   Linnie Delgrande, Dannielle Karvonen, PA-C  traMADol (ULTRAM) 50 MG tablet Take 1 tablet (50 mg total) by mouth every 6 (six) hours as needed. 04/29/18 04/29/19  Gregor Hams, MD    Allergies Penicillins and Amoxicillin  No family history on file.  Social History Social History   Tobacco Use  . Smoking status: Current Every Day Smoker    Packs/day: 1.00    Types: Cigarettes  . Smokeless tobacco: Never Used  Substance Use Topics  . Alcohol use: No  . Drug use: No    Review of Systems  Constitutional: Positive for fever. Eyes: Negative for visual changes. ENT: Negative for sore throat.  Change in taste sensation. Cardiovascular: Negative for chest pain. Respiratory: Negative for shortness of breath. Gastrointestinal: Negative for abdominal pain, vomiting and diarrhea. Genitourinary: Positive for dysuria. Musculoskeletal: Negative for back pain. Skin: Negative for rash. Neurological: Negative for headaches, focal weakness or numbness. ____________________________________________  PHYSICAL EXAM:  VITAL SIGNS: ED Triage Vitals  Enc Vitals Group     BP 12/28/18 1516 (!) 177/110     Pulse Rate 12/28/18 1516 86     Resp 12/28/18 1516 16     Temp 12/28/18 1516 98.7 F (37.1 C)     Temp Source 12/28/18 1516 Oral     SpO2 12/28/18 1516 96 %     Weight 12/28/18 1514 220 lb (99.8 kg)     Height 12/28/18 1514 4\' 11"  (1.499 m)     Head Circumference --      Peak Flow --      Pain Score 12/28/18 1513 10     Pain Loc --      Pain Edu? --      Excl. in Equality? --     Constitutional: Alert and oriented. Well appearing and in no distress. Head: Normocephalic and atraumatic. Eyes: Conjunctivae are normal. Normal extraocular movements Ears: Canals clear. TMs intact bilaterally. Nose: No congestion/rhinorrhea/epistaxis. Mouth/Throat: Mucous membranes are moist. Neck: Supple. No thyromegaly. Hematological/Lymphatic/Immunological: No cervical lymphadenopathy. Cardiovascular: Normal rate, regular rhythm. Normal distal pulses. Respiratory: Normal respiratory effort. No wheezes/rales/rhonchi. Gastrointestinal: Soft and nontender. No  distention, rebound, or guarding. Musculoskeletal: Nontender with normal range of motion in all extremities.  ____________________________________________   LABS (pertinent positives/negatives)  Labs Reviewed  URINALYSIS, COMPLETE (UACMP) WITH MICROSCOPIC - Abnormal; Notable for the following components:      Result Value   Color, Urine YELLOW (*)    APPearance CLOUDY (*)    Hgb urine dipstick LARGE (*)    Bacteria, UA RARE (*)    All other components within normal limits  NOVEL CORONAVIRUS, NAA (HOSPITAL ORDER, SEND-OUT TO REF LAB)  ____________________________________________  PROCEDURES  Procedures Bactrim DS 1 PO ____________________________________________  INITIAL IMPRESSION / ASSESSMENT AND PLAN / ED COURSE  Maria Tapia was evaluated in Emergency Department on 12/29/2018 for the symptoms described in the history of present illness. She was evaluated in the context of the global COVID-19 pandemic, which necessitated consideration that the patient might be at risk for infection with the SARS-CoV-2 virus that causes COVID-19. Institutional protocols and algorithms  that pertain to the evaluation of patients at risk for COVID-19 are in a state of rapid change based on information released by regulatory bodies including the CDC and federal and state organizations. These policies and algorithms were followed during the patient's care in the ED.  Patient with ED evaluation of symptoms of dysuria as well as concern for coronavirus after a confirmed positive exposure.  Patient's urinalysis is consistent with a likely cystitis patient be treated empirically with Bactrim DS.  She has a high suspicion for COVID-19, as such a send out test is been ordered and the patient.  She will remain at home, under quarantine, until the repeat results are reported.  A work note is provided for the patient. ____________________________________________  FINAL CLINICAL IMPRESSION(S) / ED DIAGNOSES  Final  diagnoses:  Cystitis  Suspected Covid-19 Virus Infection      Karmen StabsMenshew, Charlesetta IvoryJenise V Bacon, PA-C 12/29/18 0047    Jeanmarie PlantMcShane, James A, MD 01/12/19 [phone]

## 2018-12-30 LAB — NOVEL CORONAVIRUS, NAA (HOSP ORDER, SEND-OUT TO REF LAB; TAT 18-24 HRS): SARS-CoV-2, NAA: NOT DETECTED

## 2019-01-01 ENCOUNTER — Telehealth: Payer: Self-pay | Admitting: Emergency Medicine

## 2019-01-01 NOTE — Telephone Encounter (Signed)
Called patient and informed of negative covid 19 result. 

## 2019-06-06 ENCOUNTER — Emergency Department
Admission: EM | Admit: 2019-06-06 | Discharge: 2019-06-06 | Disposition: A | Discharge status: Home / Self Care | Payer: Self-pay | Attending: Emergency Medicine | Admitting: Emergency Medicine

## 2019-06-06 ENCOUNTER — Other Ambulatory Visit: Payer: Self-pay

## 2019-06-06 ENCOUNTER — Encounter: Payer: Self-pay | Admitting: Emergency Medicine

## 2019-06-06 DIAGNOSIS — I10 Essential (primary) hypertension: Secondary | ICD-10-CM

## 2019-06-06 DIAGNOSIS — Z20828 Contact with and (suspected) exposure to other viral communicable diseases: Secondary | ICD-10-CM | POA: Insufficient documentation

## 2019-06-06 DIAGNOSIS — Z79899 Other long term (current) drug therapy: Secondary | ICD-10-CM | POA: Insufficient documentation

## 2019-06-06 DIAGNOSIS — Z20822 Contact with and (suspected) exposure to covid-19: Secondary | ICD-10-CM

## 2019-06-06 DIAGNOSIS — J069 Acute upper respiratory infection, unspecified: Secondary | ICD-10-CM

## 2019-06-06 DIAGNOSIS — F17210 Nicotine dependence, cigarettes, uncomplicated: Secondary | ICD-10-CM | POA: Insufficient documentation

## 2019-06-06 MED ORDER — FLUTICASONE PROPIONATE 50 MCG/ACT NA SUSP: 2.0000 | Freq: Every day | NASAL | 0 refills | Status: AC

## 2019-06-06 MED ORDER — ONDANSETRON 4 MG PO TBDP
4.0000 mg | ORAL_TABLET | Freq: Once | ORAL | Status: AC
  Administered 2019-06-06: 20:00:00 4 mg via ORAL
  Filled 2019-06-06: qty 1

## 2019-06-06 MED ORDER — HYDROXYZINE PAMOATE 25 MG PO CAPS: 25.0000 mg | ORAL_CAPSULE | Freq: Every evening | ORAL | 1 refills | Status: AC | PRN

## 2019-06-06 MED ORDER — SERTRALINE HCL 25 MG PO TABS: 25.0000 mg | ORAL_TABLET | Freq: Every day | ORAL | 0 refills | Status: AC

## 2019-06-06 MED ORDER — ACETAMINOPHEN 325 MG PO TABS
650.0000 mg | ORAL_TABLET | Freq: Once | ORAL | Status: AC
  Administered 2019-06-06: 20:00:00 650 mg via ORAL
  Filled 2019-06-06: qty 2

## 2019-06-06 MED ORDER — LOSARTAN POTASSIUM 50 MG PO TABS: 50.0000 mg | ORAL_TABLET | Freq: Every day | ORAL | 3 refills | Status: DC

## 2019-06-06 MED ORDER — BENZONATATE 100 MG PO CAPS: ORAL_CAPSULE | ORAL | 0 refills | Status: DC

## 2019-06-06 NOTE — ED Notes (Signed)
Pt's ride home arrived. Pt walking out of room 44 with a steady gait. NAD noted at this time.

## 2019-06-06 NOTE — ED Notes (Addendum)
Pt st head congestion;sore throat and general weakness for 2 days; fever yesterday  100.1 at home. No flu shot. Pt exposed to coworker tested positive Trhusday. Pt st she has been out of he BP medication for a while. Pt A/ox4.

## 2019-06-06 NOTE — ED Triage Notes (Signed)
Sore throat and head congestion x 2 days.

## 2019-06-06 NOTE — Discharge Instructions (Addendum)
You are being tested for COVID. Remain at home under house quarantine until your results are available. You have also had your home meds refilled as a courtesy. You must establish care at [facility] for routine medication management.

## 2019-06-06 NOTE — ED Notes (Signed)
Pt waiting on ride, pt allowed to stay in room due to possible covid status. Pt given juice and crackers.

## 2019-06-06 NOTE — ED Provider Notes (Signed)
[facility] Emergency Department Provider Note ____________________________________________  Time seen: 1921  I have reviewed the triage vital signs and the nursing notes.  HISTORY  Chief Complaint  Nasal Congestion and Sore Throat   HPI Maria Tapia is a 33 y.o. female presents herself to the ED for evaluation of a 2-day complaint of sore throat and sinus congestion.  Patient denies any chills, vomiting, chest pain or shortness of breath. She reports subjective fevers.  She does admit that a coworker who was previously out for a dental procedure, recently tested positive.  She is here requesting Covid testing.  She is taking no medications any interim for symptom relief.  She also admits to being poorly compliant with her blood pressure medicines, and is requesting a refill.  Past Medical History:  Diagnosis Date  . Hypertension   . Lazy eye of right side   . Scoliosis     Patient Active Problem List   Diagnosis Date Noted  . Suicidal ideation 08/10/2015  . Depression, major, recurrent, moderate ([place]) 08/10/2015    Past Surgical History:  Procedure Laterality Date  . c-cection     . EYE SURGERY    . PILONIDAL CYST EXCISION    . REFRACTIVE SURGERY      Prior to Admission medications   Medication Sig Start Date End Date Taking? Authorizing Provider  benzonatate (TESSALON PERLES) 100 MG capsule Take 1-2 tabs TID prn cough 06/06/19   Nevena Rozenberg, Dannielle Karvonen, PA-C  fluticasone (FLONASE) 50 MCG/ACT nasal spray Place 2 sprays into both nostrils daily. 06/06/19   Juanantonio Stolar, Dannielle Karvonen, PA-C  hydrOXYzine (VISTARIL) 25 MG capsule Take 1 capsule (25 mg total) by mouth at bedtime as needed. 06/06/19 08/05/19  Karyssa Amaral, Dannielle Karvonen, PA-C  labetalol (NORMODYNE) 100 MG tablet Take 100 mg by mouth daily.    [provider]  losartan (COZAAR) 50 MG tablet Take 1 tablet (50 mg total) by mouth daily. 06/06/19 10/04/19  Kasyn Rolph, Dannielle Karvonen, PA-C   sertraline (ZOLOFT) 25 MG tablet Take 1 tablet (25 mg total) by mouth daily. 06/06/19 07/06/19  Brooklen Runquist, Dannielle Karvonen, PA-C  citalopram (CELEXA) 20 MG tablet Take 1 tablet (20 mg total) by mouth daily. Patient not taking: Reported on 04/01/2017 08/10/15 06/06/19  Clapacs, Madie Reno, MD  medroxyPROGESTERone (PROVERA) 10 MG tablet Take 2 tablets (20 mg total) by mouth daily. Patient not taking: Reported on 04/01/2017 03/21/16 06/06/19  Carrie Mew, MD  metoCLOPramide (REGLAN) 10 MG tablet Take 1 tablet (10 mg total) by mouth every 6 (six) hours as needed for nausea or vomiting. Patient not taking: Reported on 04/01/2017 11/24/14 06/06/19  Orbie Pyo, MD  promethazine (PHENERGAN) 25 MG tablet Take 1 tablet (25 mg total) by mouth every 6 (six) hours as needed for nausea or vomiting. 03/09/18 06/06/19  Harvest Dark, MD    Allergies Penicillins and Amoxicillin  No family history on file.  Social History Social History   Tobacco Use  . Smoking status: Current Every Day Smoker    Packs/day: 1.00    Types: Cigarettes  . Smokeless tobacco: Never Used  Substance Use Topics  . Alcohol use: No  . Drug use: No    Review of Systems  Constitutional: Negative for fever. Eyes: Negative for visual changes. ENT: Positive for sore throat. Cardiovascular: Negative for chest pain. Respiratory: Negative for shortness of breath.  Reports mild intermittent cough. Gastrointestinal: Negative for abdominal pain, vomiting and diarrhea. Genitourinary: Negative for dysuria. Musculoskeletal:  Negative for back pain. Skin: Negative for rash. Neurological: Negative for headaches, focal weakness or numbness. ____________________________________________  PHYSICAL EXAM:  VITAL SIGNS: ED Triage Vitals  Enc Vitals Group     BP 06/06/19 1809 (!) 151/116     Pulse Rate 06/06/19 1809 92     Resp 06/06/19 1809 20     Temp 06/06/19 1809 99.2 F (37.3 C)     Temp Source 06/06/19 1809 Oral      SpO2 06/06/19 1809 98 %     Weight 06/06/19 1811 200 lb (90.7 kg)     Height 06/06/19 1811 4\' 11"  (1.499 m)     Head Circumference --      Peak Flow --      Pain Score 06/06/19 1810 10     Pain Loc --      Pain Edu? --      Excl. in GC? --     Constitutional: Alert and oriented. Well appearing and in no distress. Head: Normocephalic and atraumatic. Eyes: Conjunctivae are normal. PERRL. Normal extraocular movements Ears: Canals clear. TMs intact bilaterally. Neck: Supple. No thyromegaly. Hematological/Lymphatic/Immunological: No cervical lymphadenopathy. Cardiovascular: Normal rate, regular rhythm. Normal distal pulses. Respiratory: Normal respiratory effort. No wheezes/rales/rhonchi. Gastrointestinal: Soft and nontender. No distention. Musculoskeletal: Nontender with normal range of motion in all extremities.  Neurologic:  Normal gait without ataxia. Normal speech and language. No gross focal neurologic deficits are appreciated. Skin:  Skin is warm, dry and intact. No rash noted. ____________________________________________   LABS (pertinent positives/negatives) Labs Reviewed  SARS CORONAVIRUS 2 (TAT 6-24 HRS)  ____________________________________________  PROCEDURES  Acetaminophen 650 g p.o. Ondansetron 4 mg ODT p.o. Procedures ____________________________________________  INITIAL IMPRESSION / ASSESSMENT AND PLAN / ED COURSE  Patient with ED evaluation management of a 2-day complaint of sinus congestion, head pressure and intermittent cough.  Patient presents with some concern over possible Covid exposure.  She is otherwise without any signs of acute respiratory distress.  Patient will be tested for Covid, she will be treated medically with Tessalon Perles, Flonase, and Vistaril.  Prescriptions also provided as a courtesy for her losartan and sertraline.  She will follow-up with a local provider at [facility] for routine medication management.  She will remain under  house quarantine until Covid results are available in 24 hours or less.  Maria Tapia was evaluated in Emergency Department on 06/06/2019 for the symptoms described in the history of present illness. She was evaluated in the context of the global COVID-19 pandemic, which necessitated consideration that the patient might be at risk for infection with the SARS-CoV-2 virus that causes COVID-19. Institutional protocols and algorithms that pertain to the evaluation of patients at risk for COVID-19 are in a state of rapid change based on information released by regulatory bodies including the CDC and federal and state organizations. These policies and algorithms were followed during the patient's care in the ED. ____________________________________________  FINAL CLINICAL IMPRESSION(S) / ED DIAGNOSES  Final diagnoses:  Viral URI with cough  Exposure to COVID-19 virus  Essential hypertension      Eldra Word, 14/05/2019, PA-C 06/06/19 2039    2040, MD 06/06/19 2230

## 2019-06-07 LAB — SARS CORONAVIRUS 2 (TAT 6-24 HRS): SARS Coronavirus 2: NEGATIVE

## 2019-06-08 ENCOUNTER — Telehealth: Payer: Self-pay | Admitting: General Practice

## 2019-06-08 NOTE — Telephone Encounter (Signed)
Pt was given Negative COVID-19 results. Pt expressed understanding.

## 2019-08-24 ENCOUNTER — Ambulatory Visit: Payer: Self-pay

## 2019-08-25 ENCOUNTER — Ambulatory Visit: Payer: Self-pay | Attending: Internal Medicine

## 2019-12-05 ENCOUNTER — Emergency Department
Admission: EM | Admit: 2019-12-05 | Discharge: 2019-12-05 | Disposition: A | Discharge status: Home / Self Care | Payer: Self-pay | Attending: Emergency Medicine | Admitting: Emergency Medicine

## 2019-12-05 ENCOUNTER — Other Ambulatory Visit: Payer: Self-pay

## 2019-12-05 DIAGNOSIS — Z79899 Other long term (current) drug therapy: Secondary | ICD-10-CM | POA: Insufficient documentation

## 2019-12-05 DIAGNOSIS — F17210 Nicotine dependence, cigarettes, uncomplicated: Secondary | ICD-10-CM | POA: Insufficient documentation

## 2019-12-05 DIAGNOSIS — H109 Unspecified conjunctivitis: Secondary | ICD-10-CM | POA: Insufficient documentation

## 2019-12-05 DIAGNOSIS — I10 Essential (primary) hypertension: Secondary | ICD-10-CM | POA: Insufficient documentation

## 2019-12-05 MED ORDER — TOBRAMYCIN 0.3 % OP SOLN
2.0000 [drp] | Freq: Four times a day (QID) | OPHTHALMIC | Status: DC
  Administered 2019-12-05: 2 [drp] via OPHTHALMIC
  Filled 2019-12-05: qty 5

## 2019-12-05 NOTE — Discharge Instructions (Signed)
Follow-up with [facility] if any continued problems or not improving.  Use the eyedrops to both eyes 4 times a day.  Avoid touching your eyes and after using the eyedrops wash both hands immediately.  You may also need to wash her face frequently because of the mucus in your eyelashes.  Also have your primary care provider recheck your blood pressure as it was elevated in the emergency department.

## 2019-12-05 NOTE — ED Notes (Signed)
Pt signed paper copy of discharge d/t computer in room not working

## 2019-12-05 NOTE — ED Notes (Signed)
Pt reports that left eye pain x1 week Pt reports increase tearing with drainage/crusting in the am  Pt reprots generalized weakness

## 2019-12-05 NOTE — ED Triage Notes (Signed)
Pt states L eye swelling and gunk to L eye. Pt denies drainage. A&O, ambulatory. Symptoms x 1 week. Denies hitting eye.

## 2019-12-05 NOTE — ED Provider Notes (Signed)
[facility] Emergency Department Provider Note  ____________________________________________   First MD Initiated Contact with Patient 12/05/19 1355     (approximate)  I have reviewed the triage vital signs and the nursing notes.   HISTORY  Chief Complaint Eye Problem   HPI Maria Tapia is a 34 y.o. female presents to the ED with complaint of left eye tearing and itching.  Patient states that more recently there has been mucus in the corner of her eye and also her lashes have been crusting over first thing in the mornings.  Denies any visual changes.  She is unaware of any known exposure to [place].  She denies any pain.  There is no sensation of foreign body.      Past Medical History:  Diagnosis Date   Hypertension    Lazy eye of right side    Scoliosis     Patient Active Problem List   Diagnosis Date Noted   Suicidal ideation 08/10/2015   Depression, major, recurrent, moderate (HCC) 08/10/2015    Past Surgical History:  Procedure Laterality Date   c-cection      EYE SURGERY     PILONIDAL CYST EXCISION     REFRACTIVE SURGERY      Prior to Admission medications   Medication Sig Start Date End Date Taking? Authorizing Provider  benzonatate (TESSALON PERLES) 100 MG capsule Take 1-2 tabs TID prn cough 06/06/19   Menshew, Charlesetta Ivory, PA-C  fluticasone (FLONASE) 50 MCG/ACT nasal spray Place 2 sprays into both nostrils daily. 06/06/19   Menshew, Charlesetta Ivory, PA-C  labetalol (NORMODYNE) 100 MG tablet Take 100 mg by mouth daily.    [provider]  losartan (COZAAR) 50 MG tablet Take 1 tablet (50 mg total) by mouth daily. 06/06/19 10/04/19  Menshew, Charlesetta Ivory, PA-C  sertraline (ZOLOFT) 25 MG tablet Take 1 tablet (25 mg total) by mouth daily. 06/06/19 07/06/19  Menshew, Charlesetta Ivory, PA-C  citalopram (CELEXA) 20 MG tablet Take 1 tablet (20 mg total) by mouth daily. Patient not taking: Reported on 04/01/2017 08/10/15  06/06/19  Clapacs, Jackquline Denmark, MD  medroxyPROGESTERone (PROVERA) 10 MG tablet Take 2 tablets (20 mg total) by mouth daily. Patient not taking: Reported on 04/01/2017 03/21/16 06/06/19  Sharman Cheek, MD  metoCLOPramide (REGLAN) 10 MG tablet Take 1 tablet (10 mg total) by mouth every 6 (six) hours as needed for nausea or vomiting. Patient not taking: Reported on 04/01/2017 11/24/14 06/06/19  Myrna Blazer, MD  promethazine (PHENERGAN) 25 MG tablet Take 1 tablet (25 mg total) by mouth every 6 (six) hours as needed for nausea or vomiting. 03/09/18 06/06/19  Minna Antis, MD    Allergies Penicillins and Amoxicillin  History reviewed. No pertinent family history.  Social History Social History   Tobacco Use   Smoking status: Current Every Day Smoker    Packs/day: 1.00    Types: Cigarettes   Smokeless tobacco: Never Used  Substance Use Topics   Alcohol use: No   Drug use: No    Review of Systems Constitutional: No fever/chills Eyes: No visual changes.  Drainage from the left eye. ENT: No sore throat. Cardiovascular: Denies chest pain. Respiratory: Denies shortness of breath. Musculoskeletal: Negative for muscle aches. Skin: Negative for rash. Neurological: Negative for headaches, focal weakness or numbness. ____________________________________________   PHYSICAL EXAM:  VITAL SIGNS: ED Triage Vitals  Enc Vitals Group     BP 12/05/19 1313 (!) 162/121     Pulse Rate  12/05/19 1313 87     Resp 12/05/19 1313 18     Temp 12/05/19 1313 98.4 F (36.9 C)     Temp Source 12/05/19 1313 Oral     SpO2 12/05/19 1313 100 %     Weight 12/05/19 1314 220 lb (99.8 kg)     Height 12/05/19 1314 4\' 11"  (1.499 m)     Head Circumference --      Peak Flow --      Pain Score 12/05/19 1314 10     Pain Loc --      Pain Edu? --      Excl. in [place]? --     Constitutional: Alert and oriented. Well appearing and in no acute distress. Eyes: Conjunctivae minimal injection bilaterally.   PERRL. EOMI. no foreign body noted.  There is on the left eye a thin amount of yellow mucus in the corner also with crusty lashes on the lower lid.  To the right eye there is early formation of drainage/exudate.  Longstanding history of lazy eye on the right.  Right eye drifts laterally. Head: Atraumatic. Nose: No congestion/rhinnorhea. Neck: No stridor.   Cardiovascular: Normal rate, regular rhythm. Grossly normal heart sounds.  Good peripheral circulation. Respiratory: Normal respiratory effort.  No retractions. Lungs CTAB. Musculoskeletal: Moves upper and lower extremities and normal gait was noted. Neurologic:  Normal speech and language. No gross focal neurologic deficits are appreciated. No gait instability. Skin:  Skin is warm, dry and intact. No rash noted. Psychiatric: Mood and affect are normal. Speech and behavior are normal.  ____________________________________________   LABS (all labs ordered are listed, but only abnormal results are displayed)  Labs Reviewed - No data to display   PROCEDURES  Procedure(s) performed (including Critical Care):  Procedures   ____________________________________________   INITIAL IMPRESSION / ASSESSMENT AND PLAN / ED COURSE  As part of my medical decision making, I reviewed the following data within the electronic MEDICAL RECORD NUMBER Notes from prior ED visits and [place] Controlled Substance Database  34 year old female presents to the ED with complaint of 1 week drainage from the left eye that is now causing her eyes to be matted shut in the mornings.  Patient also has some crusting beginning with the right eye.  She denies any visual changes or history of injury.  No erythema is noted however there is drainage consistent with conjunctivitis.  Patient was given Tobrex ophthalmic solution to begin using while in the ED.  She is to follow-up with [facility] if any continued problems or not  improving.  ____________________________________________   FINAL CLINICAL IMPRESSION(S) / ED DIAGNOSES  Final diagnoses:  Conjunctivitis of both eyes, unspecified conjunctivitis type     ED Discharge Orders    None       Note:  This document was prepared using Dragon voice recognition software and may include unintentional dictation errors.    Johnn Hai, PA-C 12/05/19 1526    Arta Silence, MD 12/05/19 [phone]

## 2020-02-09 ENCOUNTER — Emergency Department
Admission: EM | Admit: 2020-02-09 | Discharge: 2020-02-09 | Disposition: A | Discharge status: Home / Self Care | Payer: Self-pay | Attending: Emergency Medicine | Admitting: Emergency Medicine

## 2020-02-09 ENCOUNTER — Other Ambulatory Visit: Payer: Self-pay

## 2020-02-09 DIAGNOSIS — I10 Essential (primary) hypertension: Secondary | ICD-10-CM | POA: Insufficient documentation

## 2020-02-09 DIAGNOSIS — Z79899 Other long term (current) drug therapy: Secondary | ICD-10-CM | POA: Insufficient documentation

## 2020-02-09 DIAGNOSIS — F17210 Nicotine dependence, cigarettes, uncomplicated: Secondary | ICD-10-CM | POA: Insufficient documentation

## 2020-02-09 DIAGNOSIS — N3000 Acute cystitis without hematuria: Secondary | ICD-10-CM | POA: Insufficient documentation

## 2020-02-09 LAB — URINALYSIS, COMPLETE (UACMP) WITH MICROSCOPIC
Bilirubin Urine: NEGATIVE
Glucose, UA: NEGATIVE mg/dL
Ketones, ur: NEGATIVE mg/dL
Nitrite: NEGATIVE
Protein, ur: NEGATIVE mg/dL
Specific Gravity, Urine: 1.027 (ref 1.005–1.030)
pH: 5 (ref 5.0–8.0)

## 2020-02-09 LAB — POCT PREGNANCY, URINE: Preg Test, Ur: NEGATIVE

## 2020-02-09 MED ORDER — NITROFURANTOIN MONOHYD MACRO 100 MG PO CAPS
100.0000 mg | ORAL_CAPSULE | Freq: Once | ORAL | Status: AC
  Administered 2020-02-09: 100 mg via ORAL
  Filled 2020-02-09: qty 1

## 2020-02-09 MED ORDER — PHENAZOPYRIDINE HCL 200 MG PO TABS
200.0000 mg | ORAL_TABLET | Freq: Once | ORAL | Status: AC
  Administered 2020-02-09: 200 mg via ORAL
  Filled 2020-02-09: qty 1

## 2020-02-09 MED ORDER — NITROFURANTOIN MONOHYD MACRO 100 MG PO CAPS: 100.0000 mg | ORAL_CAPSULE | Freq: Every day | ORAL | 0 refills | Status: DC

## 2020-02-09 MED ORDER — PHENAZOPYRIDINE HCL 200 MG PO TABS: 200.0000 mg | ORAL_TABLET | Freq: Three times a day (TID) | ORAL | 0 refills | Status: AC | PRN

## 2020-02-09 NOTE — ED Notes (Signed)
Dysuria reported. Denies vaginal discharge.

## 2020-02-09 NOTE — ED Triage Notes (Signed)
Pt c/o urinary frequency and burning when she pees. Has been taken azo at home. A&O, ambulatory. No fever.

## 2020-02-09 NOTE — Discharge Instructions (Addendum)
Take the Macrobid for the next 2 days.  Then take 1 at night after sexual activity.  This should prevent the UTI.  Pyridium is for pain.  You may take that as needed

## 2020-02-09 NOTE — ED Provider Notes (Signed)
[facility] Emergency Department Provider Note  ____________________________________________   First MD Initiated Contact with Patient 02/09/20 1937     (approximate)  I have reviewed the triage vital signs and the nursing notes.   HISTORY  Chief Complaint Urinary Tract Infection    HPI Maria Tapia is a 34 y.o. female presents emergency department complaint of burning with urination for several days.  States she took Azo without any relief.  No fever or chills.  No vaginal discharge.  States she has realized that this starts after having sexual activity with her husband.  She denies any abdominal pain.    Past Medical History:  Diagnosis Date  . Hypertension   . Lazy eye of right side   . Scoliosis     Patient Active Problem List   Diagnosis Date Noted  . Suicidal ideation 08/10/2015  . Depression, major, recurrent, moderate ([facility]) 08/10/2015    Past Surgical History:  Procedure Laterality Date  . c-cection     . EYE SURGERY    . PILONIDAL CYST EXCISION    . REFRACTIVE SURGERY    . TUBAL LIGATION      Prior to Admission medications   Medication Sig Start Date End Date Taking? Authorizing Provider  benzonatate (TESSALON PERLES) 100 MG capsule Take 1-2 tabs TID prn cough 06/06/19   Menshew, Charlesetta Ivory, PA-C  fluticasone (FLONASE) 50 MCG/ACT nasal spray Place 2 sprays into both nostrils daily. 06/06/19   Menshew, Charlesetta Ivory, PA-C  labetalol (NORMODYNE) 100 MG tablet Take 100 mg by mouth daily.    [provider]  losartan (COZAAR) 50 MG tablet Take 1 tablet (50 mg total) by mouth daily. 06/06/19 10/04/19  Menshew, Charlesetta Ivory, PA-C  nitrofurantoin, macrocrystal-monohydrate, (MACROBID) 100 MG capsule Take 1 capsule (100 mg total) by mouth at bedtime. After sexual intercourse 02/09/20   Sherrie Mustache Roselyn Bering, PA-C  phenazopyridine (PYRIDIUM) 200 MG tablet Take 1 tablet (200 mg total) by mouth 3 (three) times daily as needed for  pain. 02/09/20 02/08/21  Barbee Mamula, Roselyn Bering, PA-C  sertraline (ZOLOFT) 25 MG tablet Take 1 tablet (25 mg total) by mouth daily. 06/06/19 07/06/19  Menshew, Charlesetta Ivory, PA-C  citalopram (CELEXA) 20 MG tablet Take 1 tablet (20 mg total) by mouth daily. Patient not taking: Reported on 04/01/2017 08/10/15 06/06/19  Clapacs, Jackquline Denmark, MD  medroxyPROGESTERone (PROVERA) 10 MG tablet Take 2 tablets (20 mg total) by mouth daily. Patient not taking: Reported on 04/01/2017 03/21/16 06/06/19  Sharman Cheek, MD  metoCLOPramide (REGLAN) 10 MG tablet Take 1 tablet (10 mg total) by mouth every 6 (six) hours as needed for nausea or vomiting. Patient not taking: Reported on 04/01/2017 11/24/14 06/06/19  Myrna Blazer, MD  promethazine (PHENERGAN) 25 MG tablet Take 1 tablet (25 mg total) by mouth every 6 (six) hours as needed for nausea or vomiting. 03/09/18 06/06/19  Minna Antis, MD    Allergies Penicillins and Amoxicillin  History reviewed. No pertinent family history.  Social History Social History   Tobacco Use  . Smoking status: Current Every Day Smoker    Packs/day: 1.00    Types: Cigarettes  . Smokeless tobacco: Never Used  Substance Use Topics  . Alcohol use: No  . Drug use: No    Review of Systems  Constitutional: No fever/chills Eyes: No visual changes. ENT: No sore throat. Respiratory: Denies cough Cardiovascular: Denies chest pain Gastrointestinal: Denies abdominal pain Genitourinary: Positive for dysuria.  Denies vaginal discharge  Musculoskeletal: Negative for back pain. Skin: Negative for rash. Psychiatric: no mood changes,     ____________________________________________   PHYSICAL EXAM:  VITAL SIGNS: ED Triage Vitals  Enc Vitals Group     BP 02/09/20 1846 (!) 194/109     Pulse Rate 02/09/20 1846 99     Resp 02/09/20 1846 16     Temp 02/09/20 1846 98.9 F (37.2 C)     Temp Source 02/09/20 1846 Oral     SpO2 02/09/20 1846 100 %     Weight 02/09/20 1845  210 lb (95.3 kg)     Height 02/09/20 1845 5' (1.524 m)     Head Circumference --      Peak Flow --      Pain Score 02/09/20 1845 10     Pain Loc --      Pain Edu? --      Excl. in [place]? --     Constitutional: Alert and oriented. Well appearing and in no acute distress. Eyes: Conjunctivae are normal.  Head: Atraumatic. Nose: No congestion/rhinnorhea. Mouth/Throat: Mucous membranes are moist.   Neck:  supple no lymphadenopathy noted Cardiovascular: Normal rate, regular rhythm. Heart sounds are normal Respiratory: Normal respiratory effort.  No retractions, lungs c t a  Abd: soft nontender bs normal all 4 quad GU: deferred by the patient, states she had recent STD testing at her doctor Musculoskeletal: FROM all extremities, warm and well perfused Neurologic:  Normal speech and language.  Skin:  Skin is warm, dry and intact. No rash noted. Psychiatric: Mood and affect are normal. Speech and behavior are normal.  ____________________________________________   LABS (all labs ordered are listed, but only abnormal results are displayed)  Labs Reviewed  URINALYSIS, COMPLETE (UACMP) WITH MICROSCOPIC - Abnormal; Notable for the following components:      Result Value   Color, Urine YELLOW (*)    APPearance CLOUDY (*)    Hgb urine dipstick MODERATE (*)    Leukocytes,Ua TRACE (*)    Bacteria, UA RARE (*)    All other components within normal limits  URINE CULTURE  POC URINE PREG, ED  POCT PREGNANCY, URINE   ____________________________________________   ____________________________________________  RADIOLOGY    ____________________________________________   PROCEDURES  Procedure(s) performed: No  Procedures    ____________________________________________   INITIAL IMPRESSION / ASSESSMENT AND PLAN / ED COURSE  Pertinent labs & imaging results that were available during my care of the patient were reviewed by me and considered in my medical decision making (see  chart for details).   Patient is 34 year old female presents emergency department complaining of dysuria after sex.  See HPI  Physical exam shows patient to appear well.  Blood pressure is elevated although the patient has not taken her blood pressure medication today.  Remainder the exam is unremarkable  Patient does refuse the pelvic exam.  UA has trace of leuks,   in further discussion I do feel that this is more of a cystitis that happens after sexual activity.  We will start her on a preventive Macrobid after sexual activity.  She is to follow-up with her regular doctor.  Urine culture was ordered to determine if there is any actual bacteria noted.  She was discharged stable condition.     Havanah Venturini was evaluated in Emergency Department on 02/09/2020 for the symptoms described in the history of present illness. She was evaluated in the context of the global COVID-19 pandemic, which necessitated consideration that the patient might be at  risk for infection with the SARS-CoV-2 virus that causes COVID-19. Institutional protocols and algorithms that pertain to the evaluation of patients at risk for COVID-19 are in a state of rapid change based on information released by regulatory bodies including the CDC and federal and state organizations. These policies and algorithms were followed during the patient's care in the ED.    As part of my medical decision making, I reviewed the following data within the electronic MEDICAL RECORD NUMBER Nursing notes reviewed and incorporated, Labs reviewed , Old chart reviewed, Notes from prior ED visits and [place] Controlled Substance Database  ____________________________________________   FINAL CLINICAL IMPRESSION(S) / ED DIAGNOSES  Final diagnoses:  Acute recurrent cystitis      NEW MEDICATIONS STARTED DURING THIS VISIT:  New Prescriptions   NITROFURANTOIN, MACROCRYSTAL-MONOHYDRATE, (MACROBID) 100 MG CAPSULE    Take 1 capsule (100 mg total) by mouth  at bedtime. After sexual intercourse   PHENAZOPYRIDINE (PYRIDIUM) 200 MG TABLET    Take 1 tablet (200 mg total) by mouth 3 (three) times daily as needed for pain.     Note:  This document was prepared using Dragon voice recognition software and may include unintentional dictation errors.    Faythe Ghee, PA-C 02/09/20 2121    Jene Every, MD 02/09/20 2124

## 2020-02-11 LAB — URINE CULTURE

## 2020-02-11 NOTE — ED Notes (Signed)
Patient called due to medication cost.  Explained good [facility] and gave her prices.  She does not have insurance but is applying for medicaid.  I explained [facility] and encouraged her to become a patient.  I explained that urine culture is contaminated and not helpful and that she should just take the medications.  I told her about medication management and free meds available.

## 2020-03-04 ENCOUNTER — Other Ambulatory Visit: Payer: Self-pay

## 2020-03-04 ENCOUNTER — Emergency Department
Admission: EM | Admit: 2020-03-04 | Discharge: 2020-03-04 | Disposition: A | Discharge status: Home / Self Care | Payer: Self-pay | Attending: Emergency Medicine | Admitting: Emergency Medicine

## 2020-03-04 DIAGNOSIS — I10 Essential (primary) hypertension: Secondary | ICD-10-CM | POA: Insufficient documentation

## 2020-03-04 DIAGNOSIS — F17210 Nicotine dependence, cigarettes, uncomplicated: Secondary | ICD-10-CM | POA: Insufficient documentation

## 2020-03-04 DIAGNOSIS — Z79899 Other long term (current) drug therapy: Secondary | ICD-10-CM | POA: Insufficient documentation

## 2020-03-04 DIAGNOSIS — Z20822 Contact with and (suspected) exposure to covid-19: Secondary | ICD-10-CM | POA: Insufficient documentation

## 2020-03-04 DIAGNOSIS — R109 Unspecified abdominal pain: Secondary | ICD-10-CM | POA: Insufficient documentation

## 2020-03-04 LAB — COMPREHENSIVE METABOLIC PANEL
ALT: 16 U/L (ref 0–44)
AST: 14 U/L — ABNORMAL LOW (ref 15–41)
Albumin: 3.4 g/dL — ABNORMAL LOW (ref 3.5–5.0)
Alkaline Phosphatase: 76 U/L (ref 38–126)
Anion gap: 8 (ref 5–15)
BUN: 13 mg/dL (ref 6–20)
CO2: 25 mmol/L (ref 22–32)
Calcium: 8.9 mg/dL (ref 8.9–10.3)
Chloride: 103 mmol/L (ref 98–111)
Creatinine, Ser: 0.53 mg/dL (ref 0.44–1.00)
GFR calc Af Amer: >60 mL/min (ref >60)
GFR calc non Af Amer: >60 mL/min (ref >60)
Glucose, Bld: 114 mg/dL — ABNORMAL HIGH (ref 70–99)
Potassium: 4.5 mmol/L (ref 3.5–5.1)
Sodium: 136 mmol/L (ref 135–145)
Total Bilirubin: 0.5 mg/dL (ref 0.3–1.2)
Total Protein: 7.3 g/dL (ref 6.5–8.1)

## 2020-03-04 LAB — CBC
HCT: 34.3 % — ABNORMAL LOW (ref 36.0–46.0)
Hemoglobin: 11.2 g/dL — ABNORMAL LOW (ref 12.0–15.0)
MCH: 23.9 pg — ABNORMAL LOW (ref 26.0–34.0)
MCHC: 32.7 g/dL (ref 30.0–36.0)
MCV: 73.1 fL — ABNORMAL LOW (ref 80.0–100.0)
Platelets: 481 10*3/uL — ABNORMAL HIGH (ref 150–400)
RBC: 4.69 MIL/uL (ref 3.87–5.11)
RDW: 20 % — ABNORMAL HIGH (ref 11.5–15.5)
WBC: 9.1 10*3/uL (ref 4.0–10.5)
nRBC: 0 % (ref 0.0–0.2)

## 2020-03-04 LAB — LIPASE, BLOOD: Lipase: 26 U/L (ref 11–51)

## 2020-03-04 LAB — SARS CORONAVIRUS 2 BY RT PCR (HOSPITAL ORDER, PERFORMED IN ~~LOC~~ HOSPITAL LAB): SARS Coronavirus 2: NEGATIVE

## 2020-03-04 MED ORDER — LABETALOL HCL 100 MG PO TABS: 100.0000 mg | ORAL_TABLET | Freq: Two times a day (BID) | ORAL | 0 refills | Status: DC

## 2020-03-04 MED ORDER — ACETAMINOPHEN 325 MG PO TABS
650.0000 mg | ORAL_TABLET | Freq: Once | ORAL | Status: AC
  Administered 2020-03-04: 650 mg via ORAL
  Filled 2020-03-04: qty 2

## 2020-03-04 NOTE — ED Notes (Signed)
Patient given apple juice and graham crackers per request.

## 2020-03-04 NOTE — ED Triage Notes (Addendum)
Pt comes via POV from home with c/o abdominal pain, fatigue and hypertension. Pt states her BP has been running high and unable to get it controlled. Pt states she takes her BP regularly. Pt also states headache and bilateral leg and feet swelling.  Pt states this all started Monday and has since  gotten worse.  Pt states some congestion, dry mouth, tongue and lips.  Pt also states she has been vaccinated with first does.

## 2020-03-04 NOTE — ED Provider Notes (Signed)
[facility] Emergency Department Provider Note   ____________________________________________   First MD Initiated Contact with Patient 03/04/20 1306     (approximate)  I have reviewed the triage vital signs and the nursing notes.   HISTORY  Chief Complaint Abdominal Pain and Hypertension    HPI Maria Tapia is a 34 y.o. female with a stated past medical history of hypertension who presents for hypertension that has been worsening over the last 2 weeks.  Patient states that she is concerned because she is now having headaches and swelling in bilateral lower extremities.  Patient states that she is taking her prescribed medications on time and as prescribed but she is only taking losartan.  Patient states that she took her mother's labetalol, carvedilol, and a clonidine patch which has also not lowered her blood pressure.  Patient states that she is unable to get into her primary care doctor for the next month and presents to the emergency department for further management of her hypertension.         Past Medical History:  Diagnosis Date  . Hypertension   . Lazy eye of right side   . Scoliosis     Patient Active Problem List   Diagnosis Date Noted  . Suicidal ideation 08/10/2015  . Depression, major, recurrent, moderate (HCC) 08/10/2015    Past Surgical History:  Procedure Laterality Date  . c-cection     . EYE SURGERY    . PILONIDAL CYST EXCISION    . REFRACTIVE SURGERY    . TUBAL LIGATION      Prior to Admission medications   Medication Sig Start Date End Date Taking? Authorizing Provider  benzonatate (TESSALON PERLES) 100 MG capsule Take 1-2 tabs TID prn cough 06/06/19   Menshew, Charlesetta Ivory, PA-C  fluticasone (FLONASE) 50 MCG/ACT nasal spray Place 2 sprays into both nostrils daily. 06/06/19   Menshew, Charlesetta Ivory, PA-C  labetalol (NORMODYNE) 100 MG tablet Take 1 tablet (100 mg total) by mouth 2 (two) times daily. 03/04/20  04/03/20  Merwyn Katos, MD  losartan (COZAAR) 50 MG tablet Take 1 tablet (50 mg total) by mouth daily. 06/06/19 10/04/19  Menshew, Charlesetta Ivory, PA-C  nitrofurantoin, macrocrystal-monohydrate, (MACROBID) 100 MG capsule Take 1 capsule (100 mg total) by mouth at bedtime. After sexual intercourse 02/09/20   Sherrie Mustache Roselyn Bering, PA-C  phenazopyridine (PYRIDIUM) 200 MG tablet Take 1 tablet (200 mg total) by mouth 3 (three) times daily as needed for pain. 02/09/20 02/08/21  Fisher, Roselyn Bering, PA-C  sertraline (ZOLOFT) 25 MG tablet Take 1 tablet (25 mg total) by mouth daily. 06/06/19 07/06/19  Menshew, Charlesetta Ivory, PA-C  citalopram (CELEXA) 20 MG tablet Take 1 tablet (20 mg total) by mouth daily. Patient not taking: Reported on 04/01/2017 08/10/15 06/06/19  Clapacs, Jackquline Denmark, MD  medroxyPROGESTERone (PROVERA) 10 MG tablet Take 2 tablets (20 mg total) by mouth daily. Patient not taking: Reported on 04/01/2017 03/21/16 06/06/19  Sharman Cheek, MD  metoCLOPramide (REGLAN) 10 MG tablet Take 1 tablet (10 mg total) by mouth every 6 (six) hours as needed for nausea or vomiting. Patient not taking: Reported on 04/01/2017 11/24/14 06/06/19  Myrna Blazer, MD  promethazine (PHENERGAN) 25 MG tablet Take 1 tablet (25 mg total) by mouth every 6 (six) hours as needed for nausea or vomiting. 03/09/18 06/06/19  Minna Antis, MD    Allergies Penicillins and Amoxicillin  No family history on file.  Social History Social History  Tobacco Use  . Smoking status: Current Every Day Smoker    Packs/day: 1.00    Types: Cigarettes  . Smokeless tobacco: Never Used  Substance Use Topics  . Alcohol use: No  . Drug use: No    Review of Systems Constitutional: No fever/chills Eyes: No visual changes. ENT: No sore throat. Cardiovascular: Denies chest pain. Respiratory: Denies shortness of breath. Gastrointestinal: No abdominal pain.  No nausea, no vomiting.  No diarrhea. Genitourinary: Negative for  dysuria. Musculoskeletal: Negative for acute arthralgias Skin: Negative for rash.  Endorses bilateral lower extremity edema Neurological: Negative for headaches, weakness/numbness/paresthesias in any extremity Psychiatric: Negative for suicidal ideation/homicidal ideation   ____________________________________________   PHYSICAL EXAM:  VITAL SIGNS: ED Triage Vitals  Enc Vitals Group     BP 03/04/20 1124 (!) 166/114     Pulse Rate 03/04/20 1124 77     Resp 03/04/20 1124 18     Temp 03/04/20 1124 98 F (36.7 C)     Temp src --      SpO2 03/04/20 1124 100 %     Weight 03/04/20 1122 210 lb (95.3 kg)     Height 03/04/20 1122 5' (1.524 m)     Head Circumference --      Peak Flow --      Pain Score 03/04/20 1121 6     Pain Loc --      Pain Edu? --      Excl. in GC? --    Constitutional: Alert and oriented. Well appearing and in no acute distress. Eyes: Conjunctivae are normal. PERRL. EOMI. Head: Atraumatic. Nose: No congestion/rhinnorhea. Mouth/Throat: Mucous membranes are moist. Neck: No stridor Cardiovascular: Normal rate, regular rhythm. Grossly normal heart sounds.  Good peripheral circulation. Respiratory: Normal respiratory effort.  No retractions. Gastrointestinal: Soft and nontender. No distention. Musculoskeletal: No lower extremity tenderness.  Bilateral lower extremity edema.  No joint effusions. Neurologic:  Normal speech and language. No gross focal neurologic deficits are appreciated. Skin:  Skin is warm and dry. No rash noted. Psychiatric: Mood and affect are normal. Speech and behavior are normal.  ____________________________________________   LABS (all labs ordered are listed, but only abnormal results are displayed)  Labs Reviewed  COMPREHENSIVE METABOLIC PANEL - Abnormal; Notable for the following components:      Result Value   Glucose, Bld 114 (*)    Albumin 3.4 (*)    AST 14 (*)    All other components within normal limits  CBC - Abnormal;  Notable for the following components:   Hemoglobin 11.2 (*)    HCT 34.3 (*)    MCV 73.1 (*)    MCH 23.9 (*)    RDW 20.0 (*)    Platelets 481 (*)    All other components within normal limits  SARS CORONAVIRUS 2 BY RT PCR (HOSPITAL ORDER, PERFORMED IN [facility])  LIPASE, BLOOD  URINALYSIS, COMPLETE (UACMP) WITH MICROSCOPIC  POC URINE PREG, ED   ____________________________________________ _________________   PROCEDURES  Procedure(s) performed (including Critical Care):  Procedures   ____________________________________________   INITIAL IMPRESSION / ASSESSMENT AND PLAN / ED COURSE        Presents to the emergency department complaining of high blood pressure. Patient is otherwise asymptomatic without confusion, chest pain, hematuria, or SOB. - Nonadherence to antihypertensive regimen DDx: CV, AMI, heart failure, renal infarction or failure or other end organ damage.  Disposition: Discussed with patient their elevated blood pressure and need for close outpatient management of their  hypertension. Will provide a prescription for additional antihypertensive medication and arrange for the patient to follow up in a primary care clinic      ____________________________________________   FINAL CLINICAL IMPRESSION(S) / ED DIAGNOSES  Final diagnoses:  Essential hypertension  Abdominal pain, unspecified abdominal location     ED Discharge Orders         Ordered    labetalol (NORMODYNE) 100 MG tablet  2 times daily        03/04/20 1457           Note:  This document was prepared using Dragon voice recognition software and may include unintentional dictation errors.   Merwyn Katos, MD 03/04/20 [phone]

## 2020-03-05 ENCOUNTER — Encounter: Payer: Self-pay | Admitting: Emergency Medicine

## 2020-03-05 ENCOUNTER — Emergency Department
Admission: EM | Admit: 2020-03-05 | Discharge: 2020-03-05 | Disposition: A | Discharge status: Home / Self Care | Payer: Self-pay | Attending: Emergency Medicine | Admitting: Emergency Medicine

## 2020-03-05 DIAGNOSIS — R2243 Localized swelling, mass and lump, lower limb, bilateral: Secondary | ICD-10-CM | POA: Insufficient documentation

## 2020-03-05 DIAGNOSIS — R079 Chest pain, unspecified: Secondary | ICD-10-CM | POA: Insufficient documentation

## 2020-03-05 DIAGNOSIS — R710 Precipitous drop in hematocrit: Secondary | ICD-10-CM | POA: Insufficient documentation

## 2020-03-05 DIAGNOSIS — I10 Essential (primary) hypertension: Secondary | ICD-10-CM | POA: Insufficient documentation

## 2020-03-05 DIAGNOSIS — R519 Headache, unspecified: Secondary | ICD-10-CM | POA: Insufficient documentation

## 2020-03-05 DIAGNOSIS — F17210 Nicotine dependence, cigarettes, uncomplicated: Secondary | ICD-10-CM | POA: Insufficient documentation

## 2020-03-05 DIAGNOSIS — D649 Anemia, unspecified: Secondary | ICD-10-CM

## 2020-03-05 DIAGNOSIS — Z79899 Other long term (current) drug therapy: Secondary | ICD-10-CM | POA: Insufficient documentation

## 2020-03-05 DIAGNOSIS — R5383 Other fatigue: Secondary | ICD-10-CM | POA: Insufficient documentation

## 2020-03-05 DIAGNOSIS — M7989 Other specified soft tissue disorders: Secondary | ICD-10-CM

## 2020-03-05 MED ORDER — ACETAMINOPHEN 500 MG PO TABS: 1000.0000 mg | ORAL_TABLET | Freq: Once | ORAL | Status: AC

## 2020-03-05 MED ORDER — ACETAMINOPHEN 500 MG PO TABS
ORAL_TABLET | ORAL | Status: AC
  Administered 2020-03-05: 1000 mg via ORAL
  Filled 2020-03-05: qty 2

## 2020-03-05 NOTE — ED Notes (Signed)
Dr. Katrinka Blazing notified patient is in triage room 3 waiting to be seen.

## 2020-03-05 NOTE — ED Notes (Signed)
ED Provider at bedside. 

## 2020-03-05 NOTE — ED Notes (Signed)
Pt requesting pack of graham crackers at departure, given pack.  ACSO at front desk wheeled patient to car in the parking lot.

## 2020-03-05 NOTE — ED Provider Notes (Signed)
[facility] Emergency Department Provider Note  ____________________________________________   First MD Initiated Contact with Patient 03/05/20 1249     (approximate)  I have reviewed the triage vital signs and the nursing notes.   HISTORY  Chief Complaint Fatigue   HPI Maria Tapia is a 34 y.o. female with past medical with a past medical history of scoliosis, HTN, and right amblyopia who presents for assessment of several weeks of generalized fatigue associated with some swelling in her ankles specifically present in the morning as well as on and off chest pressure and headaches.  Patient notes she was seen in the ED yesterday and recently started on labetalol twice daily.  She denies any falls or injuries.  Denies tobacco abuse or EtOH use.  Denies any current vision changes, acute focal head pain, substernal chest pain, cough, diarrhea, dysuria, vomiting, rash, focal extremity pain weakness or numbness, or other acute complaints.  No clear alleviating aggravating factors.  No prior similar episodes.  Patient states she has not seen a PCP and has an appointment next month but is mostly concerned by her fatigue and worried about her blood pressure.         Past Medical History:  Diagnosis Date  . Hypertension   . Lazy eye of right side   . Scoliosis     Patient Active Problem List   Diagnosis Date Noted  . Suicidal ideation 08/10/2015  . Depression, major, recurrent, moderate (HCC) 08/10/2015    Past Surgical History:  Procedure Laterality Date  . c-cection     . EYE SURGERY    . PILONIDAL CYST EXCISION    . REFRACTIVE SURGERY    . TUBAL LIGATION      Prior to Admission medications   Medication Sig Start Date End Date Taking? Authorizing Provider  benzonatate (TESSALON PERLES) 100 MG capsule Take 1-2 tabs TID prn cough 06/06/19   Menshew, Charlesetta Ivory, PA-C  fluticasone (FLONASE) 50 MCG/ACT nasal spray Place 2 sprays into both nostrils  daily. 06/06/19   Menshew, Charlesetta Ivory, PA-C  labetalol (NORMODYNE) 100 MG tablet Take 1 tablet (100 mg total) by mouth 2 (two) times daily. 03/04/20 04/03/20  Merwyn Katos, MD  losartan (COZAAR) 50 MG tablet Take 1 tablet (50 mg total) by mouth daily. 06/06/19 10/04/19  Menshew, Charlesetta Ivory, PA-C  nitrofurantoin, macrocrystal-monohydrate, (MACROBID) 100 MG capsule Take 1 capsule (100 mg total) by mouth at bedtime. After sexual intercourse 02/09/20   Sherrie Mustache Roselyn Bering, PA-C  phenazopyridine (PYRIDIUM) 200 MG tablet Take 1 tablet (200 mg total) by mouth 3 (three) times daily as needed for pain. 02/09/20 02/08/21  Fisher, Roselyn Bering, PA-C  sertraline (ZOLOFT) 25 MG tablet Take 1 tablet (25 mg total) by mouth daily. 06/06/19 07/06/19  Menshew, Charlesetta Ivory, PA-C  citalopram (CELEXA) 20 MG tablet Take 1 tablet (20 mg total) by mouth daily. Patient not taking: Reported on 04/01/2017 08/10/15 06/06/19  Clapacs, Jackquline Denmark, MD  medroxyPROGESTERone (PROVERA) 10 MG tablet Take 2 tablets (20 mg total) by mouth daily. Patient not taking: Reported on 04/01/2017 03/21/16 06/06/19  Sharman Cheek, MD  metoCLOPramide (REGLAN) 10 MG tablet Take 1 tablet (10 mg total) by mouth every 6 (six) hours as needed for nausea or vomiting. Patient not taking: Reported on 04/01/2017 11/24/14 06/06/19  Myrna Blazer, MD  promethazine (PHENERGAN) 25 MG tablet Take 1 tablet (25 mg total) by mouth every 6 (six) hours as needed for nausea or vomiting.  03/09/18 06/06/19  Minna Antis, MD    Allergies Penicillins and Amoxicillin  No family history on file.  Social History Social History   Tobacco Use  . Smoking status: Current Every Day Smoker    Packs/day: 1.00    Types: Cigarettes  . Smokeless tobacco: Never Used  Substance Use Topics  . Alcohol use: No  . Drug use: No    Review of Systems  Review of Systems  Constitutional: Positive for malaise/fatigue. Negative for chills and fever.  HENT: Negative for  sore throat.   Eyes: Negative for pain.  Respiratory: Negative for cough and stridor.   Cardiovascular: Positive for chest pain ( on and off over last several weeks ) and leg swelling (in morning ).  Gastrointestinal: Negative for vomiting.  Genitourinary: Negative for dysuria.  Musculoskeletal: Negative for myalgias.  Skin: Negative for rash.  Neurological: Positive for headaches ( on and off over last several weeks). Negative for seizures and loss of consciousness.  Psychiatric/Behavioral: Negative for suicidal ideas.  All other systems reviewed and are negative.     ____________________________________________   PHYSICAL EXAM:  VITAL SIGNS: ED Triage Vitals [03/05/20 1036]  Enc Vitals Group     BP 133/86     Pulse Rate 76     Resp 16     Temp 98 F (36.7 C)     Temp Source Oral     SpO2 100 %     Weight 209 lb 7 oz (95 kg)     Height 5' (1.524 m)     Head Circumference      Peak Flow      Pain Score 0     Pain Loc      Pain Edu?      Excl. in GC?    Vitals:   03/05/20 1036  BP: 133/86  Pulse: 76  Resp: 16  Temp: 98 F (36.7 C)  SpO2: 100%   Physical Exam Vitals and nursing note reviewed.  Constitutional:      General: She is not in acute distress.    Appearance: She is well-developed. She is obese.  HENT:     Head: Normocephalic and atraumatic.     Right Ear: External ear normal.     Left Ear: External ear normal.  Eyes:     Conjunctiva/sclera: Conjunctivae normal.  Cardiovascular:     Rate and Rhythm: Normal rate and regular rhythm.     Pulses: Normal pulses.     Heart sounds: No murmur heard.  No friction rub.  Pulmonary:     Effort: Pulmonary effort is normal. No respiratory distress.     Breath sounds: Normal breath sounds. No stridor. No rhonchi.  Chest:     Chest wall: No tenderness.  Abdominal:     Palpations: Abdomen is soft.     Tenderness: There is no abdominal tenderness.  Musculoskeletal:     Cervical back: Neck supple.     Right  lower leg: Edema present.     Left lower leg: Edema present.  Skin:    General: Skin is warm and dry.     Capillary Refill: Capillary refill takes less than 2 seconds.  Neurological:     Mental Status: She is alert and oriented to person, place, and time.  Psychiatric:        Mood and Affect: Mood normal.      ____________________________________________   LABS (all labs ordered are listed, but only abnormal results are displayed)  Labs  Reviewed - No data to display ____________________________________________  EKG  Sinus rhythm with a ventricular rate of 69, normal axis, unremarkable intervals, T wave inversion in lead III which is unchanged change when compared to prior no other evidence of acute ischemia or other significant underlying arrhythmia. ____________________________________________  ____________________________________________   PROCEDURES  Procedure(s) performed (including Critical Care):  Procedures   ____________________________________________   INITIAL IMPRESSION / ASSESSMENT AND PLAN / ED COURSE        Patient presents with [place] to history exam for assessment of multiple concerns including fatigue, lower extremity swelling in her feet in the mornings, intermittent headaches and chest pressure over the last several weeks.  Patient is afebrile hemodynamically stable with normal blood pressure arrival.  Exam as above.  EKG shows no evidence of acute ischemia or arrhythmia.  I did review patient's work-up and presentation in the ED yesterday.  Yesterday patient had a CBC, CMP, lipase, and Covid test obtained.  These were all unremarkable.  Do not believe repeat testing is indicated at this time low suspicion for acute infectious process,, CVA, arrhythmia, ACS, significant metabolic derangement, or other immediate life-threatening process at this time.  Advised patient that her blood pressure was within normal limits today and she is to continue taking medicine  as prescribed to her.  I did advise patient that her hemoglobin obtained yesterday was right at the borderline of being anemic below normal levels and that will be reasonable for her PCP to check an iron level as well as her thyroid given these can both cause fatigue.  I did provide patient additional PCP referral to [facility] clinic to see if she can get into see PCP sooner than 1 month.  Given stable vital signs with reassuring exam today as well as reassuring labs obtained within 24 hours and duration of symptoms I do believe she is safe for discharge with plan for outpatient PCP follow-up.  Discharged stable condition.  ____________________________________________   FINAL CLINICAL IMPRESSION(S) / ED DIAGNOSES  Final diagnoses:  Other fatigue  Low hemoglobin  Leg swelling    Medications  acetaminophen (TYLENOL) tablet 1,000 mg (1,000 mg Oral Given 03/05/20 1312)     ED Discharge Orders    None       Note:  This document was prepared using Dragon voice recognition software and may include unintentional dictation errors.   Gilles Chiquito, MD 03/05/20 1315

## 2020-03-05 NOTE — ED Triage Notes (Signed)
Pt to ED via POV stating that she was seen here yesterday for fatigue and high blood pressure. Pt states that she concerned about the swelling in her feet and the fact that she is tired all the time. Pt is in NAD.

## 2020-07-01 ENCOUNTER — Other Ambulatory Visit: Payer: Self-pay

## 2020-07-01 ENCOUNTER — Encounter: Payer: Self-pay | Admitting: Emergency Medicine

## 2020-07-01 ENCOUNTER — Emergency Department
Admission: EM | Admit: 2020-07-01 | Discharge: 2020-07-01 | Disposition: A | Discharge status: Home / Self Care | Payer: HRSA Program | Attending: Emergency Medicine | Admitting: Emergency Medicine

## 2020-07-01 DIAGNOSIS — U071 COVID-19: Secondary | ICD-10-CM | POA: Diagnosis not present

## 2020-07-01 DIAGNOSIS — I10 Essential (primary) hypertension: Secondary | ICD-10-CM | POA: Insufficient documentation

## 2020-07-01 DIAGNOSIS — F17210 Nicotine dependence, cigarettes, uncomplicated: Secondary | ICD-10-CM | POA: Insufficient documentation

## 2020-07-01 DIAGNOSIS — Z79899 Other long term (current) drug therapy: Secondary | ICD-10-CM | POA: Insufficient documentation

## 2020-07-01 DIAGNOSIS — Z20822 Contact with and (suspected) exposure to covid-19: Secondary | ICD-10-CM

## 2020-07-01 DIAGNOSIS — R070 Pain in throat: Secondary | ICD-10-CM | POA: Diagnosis present

## 2020-07-01 MED ORDER — BENZONATATE 100 MG PO CAPS: 100.0000 mg | ORAL_CAPSULE | Freq: Three times a day (TID) | ORAL | 0 refills | Status: AC | PRN

## 2020-07-01 MED ORDER — ACETAMINOPHEN 325 MG PO TABS
650.0000 mg | ORAL_TABLET | Freq: Once | ORAL | Status: AC
  Administered 2020-07-01: 650 mg via ORAL
  Filled 2020-07-01: qty 2

## 2020-07-01 MED ORDER — BENZONATATE 100 MG PO CAPS
100.0000 mg | ORAL_CAPSULE | Freq: Once | ORAL | Status: AC
  Administered 2020-07-01: 100 mg via ORAL
  Filled 2020-07-01: qty 1

## 2020-07-01 NOTE — ED Triage Notes (Signed)
C/O cough, sore throat, chills x 1 day.  AAOx3.  Skin warm and dry. NAD

## 2020-07-01 NOTE — ED Notes (Signed)
Pt states is having generalized body aches and it is painful to move. Moist oral mucus membranes noted. No resp distress noted. Pt states she has not taken her antihypertensives today.

## 2020-07-01 NOTE — ED Provider Notes (Signed)
[facility] Emergency Department Provider Note  ____________________________________________   Event Date/Time   First MD Initiated Contact with Patient 07/01/20 2008     (approximate)  I have reviewed the triage vital signs and the nursing notes.   HISTORY  Chief Complaint Sore Throat  HPI Maria Tapia is a 35 y.o. female who reports to the emergency department for evaluation of generally not feeling well since yesterday.  Symptoms include cough, runny nose, body aches, fever.  She states that she has not tried any alleviating medications for her symptoms.  She states that her daughter tested positive for Covid on Tuesday and that her mom and dad both tested positive on Monday.  She states that she is vaccinated fully.  She does have history of high blood pressure but is otherwise healthy.          Past Medical History:  Diagnosis Date   Hypertension    Lazy eye of right side    Scoliosis     Patient Active Problem List   Diagnosis Date Noted   Suicidal ideation 08/10/2015   Depression, major, recurrent, moderate (HCC) 08/10/2015    Past Surgical History:  Procedure Laterality Date   c-cection      EYE SURGERY     PILONIDAL CYST EXCISION     REFRACTIVE SURGERY     TUBAL LIGATION      Prior to Admission medications   Medication Sig Start Date End Date Taking? Authorizing Provider  benzonatate (TESSALON PERLES) 100 MG capsule Take 1-2 tabs TID prn cough 06/06/19   Menshew, Charlesetta Ivory, PA-C  fluticasone (FLONASE) 50 MCG/ACT nasal spray Place 2 sprays into both nostrils daily. 06/06/19   Menshew, Charlesetta Ivory, PA-C  labetalol (NORMODYNE) 100 MG tablet Take 1 tablet (100 mg total) by mouth 2 (two) times daily. 03/04/20 04/03/20  Merwyn Katos, MD  losartan (COZAAR) 50 MG tablet Take 1 tablet (50 mg total) by mouth daily. 06/06/19 10/04/19  Menshew, Charlesetta Ivory, PA-C  nitrofurantoin, macrocrystal-monohydrate, (MACROBID) 100  MG capsule Take 1 capsule (100 mg total) by mouth at bedtime. After sexual intercourse 02/09/20   Sherrie Mustache Roselyn Bering, PA-C  phenazopyridine (PYRIDIUM) 200 MG tablet Take 1 tablet (200 mg total) by mouth 3 (three) times daily as needed for pain. 02/09/20 02/08/21  Fisher, Roselyn Bering, PA-C  sertraline (ZOLOFT) 25 MG tablet Take 1 tablet (25 mg total) by mouth daily. 06/06/19 07/06/19  Menshew, Charlesetta Ivory, PA-C  citalopram (CELEXA) 20 MG tablet Take 1 tablet (20 mg total) by mouth daily. Patient not taking: Reported on 04/01/2017 08/10/15 06/06/19  Clapacs, Jackquline Denmark, MD  medroxyPROGESTERone (PROVERA) 10 MG tablet Take 2 tablets (20 mg total) by mouth daily. Patient not taking: Reported on 04/01/2017 03/21/16 06/06/19  Sharman Cheek, MD  metoCLOPramide (REGLAN) 10 MG tablet Take 1 tablet (10 mg total) by mouth every 6 (six) hours as needed for nausea or vomiting. Patient not taking: Reported on 04/01/2017 11/24/14 06/06/19  Myrna Blazer, MD  promethazine (PHENERGAN) 25 MG tablet Take 1 tablet (25 mg total) by mouth every 6 (six) hours as needed for nausea or vomiting. 03/09/18 06/06/19  Minna Antis, MD    Allergies Penicillins and Amoxicillin  No family history on file.  Social History Social History   Tobacco Use   Smoking status: Current Every Day Smoker    Packs/day: 1.00    Types: Cigarettes   Smokeless tobacco: Never Used  Substance Use Topics  Alcohol use: No   Drug use: No    Review of Systems Constitutional: + Body aches, + generalized weakness, + fever/chills Eyes: No visual changes. ENT: No sore throat. Cardiovascular: Denies chest pain. Respiratory: Denies shortness of breath. Gastrointestinal: No abdominal pain.  No nausea, no vomiting.  No diarrhea.  No constipation. Genitourinary: Negative for dysuria. Musculoskeletal: Negative for back pain. Skin: Negative for rash. Neurological: Negative for headaches, focal weakness or  numbness. ____________________________________________   PHYSICAL EXAM:  VITAL SIGNS: ED Triage Vitals  Enc Vitals Group     BP 07/01/20 1811 (!) 163/117     Pulse Rate 07/01/20 1811 97     Resp 07/01/20 1811 18     Temp 07/01/20 1811 (!) 100.7 F (38.2 C)     Temp Source 07/01/20 1811 Oral     SpO2 07/01/20 1811 100 %     Weight 07/01/20 1718 209 lb 7 oz (95 kg)     Height 07/01/20 1718 5' (1.524 m)     Head Circumference --      Peak Flow --      Pain Score 07/01/20 1717 7     Pain Loc --      Pain Edu? --      Excl. in GC? --    Constitutional: Alert and oriented. Well appearing and in no acute distress. Eyes: Conjunctivae are normal. PERRL. EOMI. Head: Atraumatic. Nose: No congestion/rhinnorhea. Mouth/Throat: Mucous membranes are moist.  Oropharynx erythematous without tonsillar enlargement or exudate. Neck: No stridor.   Lymphatic: No cervical lymphadenopathy Cardiovascular: Normal rate, regular rhythm. Grossly normal heart sounds.  Good peripheral circulation. Respiratory: Normal respiratory effort.  No retractions. Lungs CTAB. Gastrointestinal: Soft and nontender. No distention. No abdominal bruits. No CVA tenderness. Musculoskeletal: No lower extremity tenderness nor edema.  No joint effusions. Neurologic:  Normal speech and language. No gross focal neurologic deficits are appreciated. No gait instability. Skin:  Skin is warm, dry and intact. No rash noted. Psychiatric: Mood and affect are normal. Speech and behavior are normal.   ____________________________________________   INITIAL IMPRESSION / ASSESSMENT AND PLAN / ED COURSE  As part of my medical decision making, I reviewed the following data within the electronic MEDICAL RECORD NUMBER Nursing notes reviewed and incorporated        Patient is a 35 year old female who reports to the emergency department for evaluation of Covid-like symptoms after known Covid exposure.  See HPI for further details.  In triage,  the patient is febrile with a temperature of 100.7, hypertensive at 163/117.  This is reportedly baseline for the patient she is asymptomatic of her high blood pressure.  Physical exam, the patient does not have any focal findings though she does appear cold.  Suspect COVID-19 given known exposure.  Will send for 6 a 24-hour PCR test the patient will be notified if she is positive.  In the interim, discussed symptomatic treatment with the patient including alternating Tylenol and ibuprofen for fever and body aches and using Tessalon Perles for cough.  The patient is amenable with this plan she stable at time for outpatient therapy.  She agrees to return for any acute worsening.      ____________________________________________   FINAL CLINICAL IMPRESSION(S) / ED DIAGNOSES  Final diagnoses:  None     ED Discharge Orders    None      *Please note:  Maria Tapia was evaluated in Emergency Department on 07/01/2020 for the symptoms described in the history of present illness.  She was evaluated in the context of the global COVID-19 pandemic, which necessitated consideration that the patient might be at risk for infection with the SARS-CoV-2 virus that causes COVID-19. Institutional protocols and algorithms that pertain to the evaluation of patients at risk for COVID-19 are in a state of rapid change based on information released by regulatory bodies including the CDC and federal and state organizations. These policies and algorithms were followed during the patient's care in the ED.  Some ED evaluations and interventions may be delayed as a result of limited staffing during and the pandemic.*   Note:  This document was prepared using Dragon voice recognition software and may include unintentional dictation errors.    Marlana Salvage, PA 07/01/20 2328    Vladimir Crofts, MD 07/04/20 [phone]

## 2020-07-02 LAB — SARS CORONAVIRUS 2 (TAT 6-24 HRS): SARS Coronavirus 2: POSITIVE — AB

## 2020-09-01 ENCOUNTER — Other Ambulatory Visit: Payer: Self-pay

## 2020-09-01 ENCOUNTER — Encounter: Payer: Self-pay | Admitting: Emergency Medicine

## 2020-09-01 DIAGNOSIS — Z5321 Procedure and treatment not carried out due to patient leaving prior to being seen by health care provider: Secondary | ICD-10-CM | POA: Insufficient documentation

## 2020-09-01 DIAGNOSIS — R1030 Lower abdominal pain, unspecified: Secondary | ICD-10-CM | POA: Insufficient documentation

## 2020-09-01 DIAGNOSIS — M545 Low back pain, unspecified: Secondary | ICD-10-CM | POA: Insufficient documentation

## 2020-09-01 DIAGNOSIS — R3915 Urgency of urination: Secondary | ICD-10-CM | POA: Insufficient documentation

## 2020-09-01 DIAGNOSIS — R82998 Other abnormal findings in urine: Secondary | ICD-10-CM | POA: Insufficient documentation

## 2020-09-01 LAB — URINALYSIS, COMPLETE (UACMP) WITH MICROSCOPIC
Bacteria, UA: NONE SEEN
Bilirubin Urine: NEGATIVE
Glucose, UA: NEGATIVE mg/dL
Ketones, ur: 5 mg/dL — AB
Leukocytes,Ua: NEGATIVE
Nitrite: NEGATIVE
Protein, ur: NEGATIVE mg/dL
Specific Gravity, Urine: 1.029 (ref 1.005–1.030)
Squamous Epithelial / HPF: NONE SEEN (ref 0–5)
WBC, UA: NONE SEEN WBC/hpf (ref 0–5)
pH: 5 (ref 5.0–8.0)

## 2020-09-01 LAB — CBC
HCT: 35.1 % — ABNORMAL LOW (ref 36.0–46.0)
Hemoglobin: 10.7 g/dL — ABNORMAL LOW (ref 12.0–15.0)
MCH: 22.8 pg — ABNORMAL LOW (ref 26.0–34.0)
MCHC: 30.5 g/dL (ref 30.0–36.0)
MCV: 74.8 fL — ABNORMAL LOW (ref 80.0–100.0)
Platelets: 568 10*3/uL — ABNORMAL HIGH (ref 150–400)
RBC: 4.69 MIL/uL (ref 3.87–5.11)
RDW: 17.2 % — ABNORMAL HIGH (ref 11.5–15.5)
WBC: 11 10*3/uL — ABNORMAL HIGH (ref 4.0–10.5)
nRBC: 0 % (ref 0.0–0.2)

## 2020-09-01 LAB — COMPREHENSIVE METABOLIC PANEL
ALT: 11 U/L (ref 0–44)
AST: 16 U/L (ref 15–41)
Albumin: 3.7 g/dL (ref 3.5–5.0)
Alkaline Phosphatase: 66 U/L (ref 38–126)
Anion gap: 8 (ref 5–15)
BUN: 16 mg/dL (ref 6–20)
CO2: 28 mmol/L (ref 22–32)
Calcium: 9 mg/dL (ref 8.9–10.3)
Chloride: 103 mmol/L (ref 98–111)
Creatinine, Ser: 0.64 mg/dL (ref 0.44–1.00)
GFR, Estimated: >60 mL/min (ref >60)
Glucose, Bld: 100 mg/dL — ABNORMAL HIGH (ref 70–99)
Potassium: 3.8 mmol/L (ref 3.5–5.1)
Sodium: 139 mmol/L (ref 135–145)
Total Bilirubin: 0.4 mg/dL (ref 0.3–1.2)
Total Protein: 7.5 g/dL (ref 6.5–8.1)

## 2020-09-01 LAB — POC URINE PREG, ED: Preg Test, Ur: NEGATIVE

## 2020-09-01 NOTE — ED Triage Notes (Signed)
Pt in with co lower back pain and lower abd cramping. Does have urinary urgency and foul odor. Denies any discharge, pt does have hx of UTI's.

## 2020-09-02 ENCOUNTER — Emergency Department
Admission: EM | Admit: 2020-09-02 | Discharge: 2020-09-02 | Disposition: A | Discharge status: Home / Self Care | Payer: Self-pay | Attending: Emergency Medicine | Admitting: Emergency Medicine

## 2020-09-02 HISTORY — DX: Obstructive and reflux uropathy, unspecified: N13.9

## 2020-10-06 ENCOUNTER — Ambulatory Visit: Payer: Self-pay

## 2021-01-25 ENCOUNTER — Other Ambulatory Visit: Payer: Self-pay

## 2021-01-25 DIAGNOSIS — Z5321 Procedure and treatment not carried out due to patient leaving prior to being seen by health care provider: Secondary | ICD-10-CM | POA: Insufficient documentation

## 2021-01-25 DIAGNOSIS — R1030 Lower abdominal pain, unspecified: Secondary | ICD-10-CM | POA: Insufficient documentation

## 2021-01-25 DIAGNOSIS — M549 Dorsalgia, unspecified: Secondary | ICD-10-CM | POA: Insufficient documentation

## 2021-01-25 LAB — CBC
HCT: 30.9 % — ABNORMAL LOW (ref 36.0–46.0)
Hemoglobin: 9.7 g/dL — ABNORMAL LOW (ref 12.0–15.0)
MCH: 23.3 pg — ABNORMAL LOW (ref 26.0–34.0)
MCHC: 31.4 g/dL (ref 30.0–36.0)
MCV: 74.1 fL — ABNORMAL LOW (ref 80.0–100.0)
Platelets: 577 10*3/uL — ABNORMAL HIGH (ref 150–400)
RBC: 4.17 MIL/uL (ref 3.87–5.11)
RDW: 19 % — ABNORMAL HIGH (ref 11.5–15.5)
WBC: 11.7 10*3/uL — ABNORMAL HIGH (ref 4.0–10.5)
nRBC: 0 % (ref 0.0–0.2)

## 2021-01-25 LAB — URINALYSIS, COMPLETE (UACMP) WITH MICROSCOPIC
Bilirubin Urine: NEGATIVE
Glucose, UA: NEGATIVE mg/dL
Ketones, ur: 5 mg/dL — AB
Leukocytes,Ua: NEGATIVE
Nitrite: NEGATIVE
Protein, ur: 30 mg/dL — AB
Specific Gravity, Urine: 1.032 — ABNORMAL HIGH (ref 1.005–1.030)
pH: 5 (ref 5.0–8.0)

## 2021-01-25 LAB — COMPREHENSIVE METABOLIC PANEL
ALT: 10 U/L (ref 0–44)
AST: 18 U/L (ref 15–41)
Albumin: 3.4 g/dL — ABNORMAL LOW (ref 3.5–5.0)
Alkaline Phosphatase: 62 U/L (ref 38–126)
Anion gap: 8 (ref 5–15)
BUN: 14 mg/dL (ref 6–20)
CO2: 24 mmol/L (ref 22–32)
Calcium: 8.6 mg/dL — ABNORMAL LOW (ref 8.9–10.3)
Chloride: 105 mmol/L (ref 98–111)
Creatinine, Ser: 0.67 mg/dL (ref 0.44–1.00)
GFR, Estimated: >60 mL/min (ref >60)
Glucose, Bld: 114 mg/dL — ABNORMAL HIGH (ref 70–99)
Potassium: 3.5 mmol/L (ref 3.5–5.1)
Sodium: 137 mmol/L (ref 135–145)
Total Bilirubin: 0.6 mg/dL (ref 0.3–1.2)
Total Protein: 7.2 g/dL (ref 6.5–8.1)

## 2021-01-25 LAB — LIPASE, BLOOD: Lipase: 31 U/L (ref 11–51)

## 2021-01-25 LAB — POC URINE PREG, ED: Preg Test, Ur: NEGATIVE

## 2021-01-25 NOTE — ED Triage Notes (Signed)
Pt to ED for back pain radiating down legs and abdomen that happens every month after periods for last 6 months.  Pt in NAD Reports lower abd pain

## 2021-01-26 ENCOUNTER — Emergency Department
Admission: EM | Admit: 2021-01-26 | Discharge: 2021-01-26 | Discharge status: Against Medical Advice | Payer: Self-pay | Attending: Emergency Medicine | Admitting: Emergency Medicine

## 2021-05-02 ENCOUNTER — Other Ambulatory Visit: Payer: Self-pay

## 2021-05-02 ENCOUNTER — Encounter: Payer: Self-pay | Admitting: Emergency Medicine

## 2021-05-02 ENCOUNTER — Emergency Department
Admission: EM | Admit: 2021-05-02 | Discharge: 2021-05-02 | Disposition: A | Discharge status: Home / Self Care | Payer: Self-pay | Attending: Emergency Medicine | Admitting: Emergency Medicine

## 2021-05-02 DIAGNOSIS — I10 Essential (primary) hypertension: Secondary | ICD-10-CM | POA: Insufficient documentation

## 2021-05-02 DIAGNOSIS — F17210 Nicotine dependence, cigarettes, uncomplicated: Secondary | ICD-10-CM | POA: Insufficient documentation

## 2021-05-02 DIAGNOSIS — L0501 Pilonidal cyst with abscess: Secondary | ICD-10-CM | POA: Insufficient documentation

## 2021-05-02 DIAGNOSIS — L0591 Pilonidal cyst without abscess: Secondary | ICD-10-CM

## 2021-05-02 DIAGNOSIS — N751 Abscess of Bartholin's gland: Secondary | ICD-10-CM | POA: Insufficient documentation

## 2021-05-02 DIAGNOSIS — Z79899 Other long term (current) drug therapy: Secondary | ICD-10-CM | POA: Insufficient documentation

## 2021-05-02 LAB — CBC WITH DIFFERENTIAL/PLATELET
Abs Immature Granulocytes: 0.04 10*3/uL (ref 0.00–0.07)
Basophils Absolute: 0 10*3/uL (ref 0.0–0.1)
Basophils Relative: 0 %
Eosinophils Absolute: 0.2 10*3/uL (ref 0.0–0.5)
Eosinophils Relative: 2 %
HCT: 34.9 % — ABNORMAL LOW (ref 36.0–46.0)
Hemoglobin: 10.5 g/dL — ABNORMAL LOW (ref 12.0–15.0)
Immature Granulocytes: 0 %
Lymphocytes Relative: 29 %
Lymphs Abs: 2.9 10*3/uL (ref 0.7–4.0)
MCH: 21.5 pg — ABNORMAL LOW (ref 26.0–34.0)
MCHC: 30.1 g/dL (ref 30.0–36.0)
MCV: 71.5 fL — ABNORMAL LOW (ref 80.0–100.0)
Monocytes Absolute: 0.6 10*3/uL (ref 0.1–1.0)
Monocytes Relative: 6 %
Neutro Abs: 6.3 10*3/uL (ref 1.7–7.7)
Neutrophils Relative %: 63 %
Platelets: 684 10*3/uL — ABNORMAL HIGH (ref 150–400)
RBC: 4.88 MIL/uL (ref 3.87–5.11)
RDW: 19.1 % — ABNORMAL HIGH (ref 11.5–15.5)
WBC: 10 10*3/uL (ref 4.0–10.5)
nRBC: 0 % (ref 0.0–0.2)

## 2021-05-02 LAB — COMPREHENSIVE METABOLIC PANEL
ALT: 10 U/L (ref 0–44)
AST: 14 U/L — ABNORMAL LOW (ref 15–41)
Albumin: 3 g/dL — ABNORMAL LOW (ref 3.5–5.0)
Alkaline Phosphatase: 64 U/L (ref 38–126)
Anion gap: 9 (ref 5–15)
BUN: 10 mg/dL (ref 6–20)
CO2: 27 mmol/L (ref 22–32)
Calcium: 8.8 mg/dL — ABNORMAL LOW (ref 8.9–10.3)
Chloride: 101 mmol/L (ref 98–111)
Creatinine, Ser: 0.62 mg/dL (ref 0.44–1.00)
GFR, Estimated: >60 mL/min (ref >60)
Glucose, Bld: 113 mg/dL — ABNORMAL HIGH (ref 70–99)
Potassium: 3.5 mmol/L (ref 3.5–5.1)
Sodium: 137 mmol/L (ref 135–145)
Total Bilirubin: 0.6 mg/dL (ref 0.3–1.2)
Total Protein: 7.4 g/dL (ref 6.5–8.1)

## 2021-05-02 LAB — LACTIC ACID, PLASMA: Lactic Acid, Venous: 1.2 mmol/L (ref 0.5–1.9)

## 2021-05-02 MED ORDER — CLINDAMYCIN HCL 300 MG PO CAPS: 300.0000 mg | ORAL_CAPSULE | Freq: Three times a day (TID) | ORAL | 0 refills | Status: AC

## 2021-05-02 MED ORDER — CLINDAMYCIN HCL 150 MG PO CAPS
300.0000 mg | ORAL_CAPSULE | Freq: Once | ORAL | Status: AC
  Administered 2021-05-02: 300 mg via ORAL
  Filled 2021-05-02: qty 2

## 2021-05-02 MED ORDER — CEFDINIR 300 MG PO CAPS: 300.0000 mg | ORAL_CAPSULE | Freq: Two times a day (BID) | ORAL | 0 refills | Status: AC

## 2021-05-02 MED ORDER — OXYCODONE-ACETAMINOPHEN 5-325 MG PO TABS
1.0000 | ORAL_TABLET | Freq: Once | ORAL | Status: AC
  Administered 2021-05-02: 1 via ORAL
  Filled 2021-05-02: qty 1

## 2021-05-02 MED ORDER — CEFDINIR 300 MG PO CAPS
300.0000 mg | ORAL_CAPSULE | Freq: Two times a day (BID) | ORAL | Status: DC
  Administered 2021-05-02: 300 mg via ORAL
  Filled 2021-05-02 (×2): qty 1

## 2021-05-02 MED ORDER — OXYCODONE-ACETAMINOPHEN 5-325 MG PO TABS: 1.0000 | ORAL_TABLET | ORAL | 0 refills | Status: AC | PRN

## 2021-05-02 NOTE — ED Notes (Signed)
Pt was given excuse note for work for being seen at ED today and per EDP can return to work tomorrow. Pt reported had traffic court tomorrow and due to pain while sitting and need for taking rx pain medication if she could have it extended due to pain concerns for traveling and sitting tomorrow am. Per EDP, with her condition, we can only write the note for today. Informed her that unless she had to be rechecked for worsening condition, we can only excuse her for today. Pt verbalized understanding. Also spoke to pt about rx pain medication and driving contraindication and suggested having transportation tomorrow for safety if she required the rx pain medication in the am.

## 2021-05-02 NOTE — ED Notes (Signed)
Provided pt with graham crackers and apple juice for medications. Rechecked vitals and pt reported she had not taken her Losartan this am.

## 2021-05-02 NOTE — ED Triage Notes (Signed)
Pt states also thinks now she has a bartholin cyst that developed after piloidal ruptured, pt also c/o nausea and generalized body aches at this time.

## 2021-05-02 NOTE — ED Provider Notes (Signed)
[facility] Emergency Department Provider Note   ____________________________________________   Event Date/Time   First MD Initiated Contact with Patient 05/02/21 1015     (approximate)  I have reviewed the triage vital signs and the nursing notes.   HISTORY  Chief Complaint Abscess and Nausea    HPI Maria Tapia is a 35 y.o. female with past medical history of hypertension who presents to the ED complaining of abscesses.  Patient reports that she initially noticed a painful swollen area to the top of her gluteal cleft about 1 week ago.  It began draining on its own about 3 days ago, at which point in time the swelling and pain has begun to improve.  She describes this as similar to prior pilonidal abscesses, for which she had followed with general surgery at [facility] in the past.  Around that same time, she started to notice painful swelling on the left side of her labia.  This area has gotten more swollen and painful and she has not noticed any drainage.  She has never seen a specialist for Bartholin's abscesses or required drainage of them before.  She has had them before but they typically drain on her own and she has never had them incised.  She denies any fevers, abdominal pain, nausea, vomiting, or vaginal bleeding.        Past Medical History:  Diagnosis Date   Hypertension    Lazy eye of right side    Scoliosis    Urinary (tract) obstruction     Patient Active Problem List   Diagnosis Date Noted   Suicidal ideation 08/10/2015   Depression, major, recurrent, moderate (HCC) 08/10/2015    Past Surgical History:  Procedure Laterality Date   c-cection      EYE SURGERY     PILONIDAL CYST EXCISION     REFRACTIVE SURGERY     TUBAL LIGATION      Prior to Admission medications   Medication Sig Start Date End Date Taking? Authorizing Provider  cefdinir (OMNICEF) 300 MG capsule Take 1 capsule (300 mg total) by mouth 2 (two) times daily for 7  days. 05/02/21 05/09/21 Yes Chesley Noon, MD  clindamycin (CLEOCIN) 300 MG capsule Take 1 capsule (300 mg total) by mouth 3 (three) times daily for 7 days. 05/02/21 05/09/21 Yes Chesley Noon, MD  oxyCODONE-acetaminophen (PERCOCET) 5-325 MG tablet Take 1 tablet by mouth every 4 (four) hours as needed for severe pain. 05/02/21 05/02/22 Yes Chesley Noon, MD  benzonatate (TESSALON PERLES) 100 MG capsule Take 1 capsule (100 mg total) by mouth 3 (three) times daily as needed for cough. 07/01/20 07/01/21  Lucy Chris, PA  fluticasone (FLONASE) 50 MCG/ACT nasal spray Place 2 sprays into both nostrils daily. 06/06/19   Menshew, Charlesetta Ivory, PA-C  labetalol (NORMODYNE) 100 MG tablet Take 1 tablet (100 mg total) by mouth 2 (two) times daily. 03/04/20 04/03/20  Merwyn Katos, MD  losartan (COZAAR) 50 MG tablet Take 1 tablet (50 mg total) by mouth daily. 06/06/19 10/04/19  Menshew, Charlesetta Ivory, PA-C  nitrofurantoin, macrocrystal-monohydrate, (MACROBID) 100 MG capsule Take 1 capsule (100 mg total) by mouth at bedtime. After sexual intercourse 02/09/20   Sherrie Mustache Roselyn Bering, PA-C  sertraline (ZOLOFT) 25 MG tablet Take 1 tablet (25 mg total) by mouth daily. 06/06/19 07/06/19  Menshew, Charlesetta Ivory, PA-C  citalopram (CELEXA) 20 MG tablet Take 1 tablet (20 mg total) by mouth daily. Patient not taking: Reported on 04/01/2017 08/10/15  06/06/19  Clapacs, Jackquline Denmark, MD  medroxyPROGESTERone (PROVERA) 10 MG tablet Take 2 tablets (20 mg total) by mouth daily. Patient not taking: Reported on 04/01/2017 03/21/16 06/06/19  Sharman Cheek, MD  metoCLOPramide (REGLAN) 10 MG tablet Take 1 tablet (10 mg total) by mouth every 6 (six) hours as needed for nausea or vomiting. Patient not taking: Reported on 04/01/2017 11/24/14 06/06/19  Myrna Blazer, MD  promethazine (PHENERGAN) 25 MG tablet Take 1 tablet (25 mg total) by mouth every 6 (six) hours as needed for nausea or vomiting. 03/09/18 06/06/19  Minna Antis, MD     Allergies Penicillins and Amoxicillin  History reviewed. No pertinent family history.  Social History Social History   Tobacco Use   Smoking status: Every Day    Packs/day: 1.00    Types: Cigarettes   Smokeless tobacco: Never  Substance Use Topics   Alcohol use: No   Drug use: No    Review of Systems  Constitutional: No fever/chills Eyes: No visual changes. ENT: No sore throat. Cardiovascular: Denies chest pain. Respiratory: Denies shortness of breath. Gastrointestinal: No abdominal pain.  No nausea, no vomiting.  No diarrhea.  No constipation. Genitourinary: Negative for dysuria. Musculoskeletal: Negative for back pain. Skin: Negative for rash.  Positive for abscesses. Neurological: Negative for headaches, focal weakness or numbness.  ____________________________________________   PHYSICAL EXAM:  VITAL SIGNS: ED Triage Vitals  Enc Vitals Group     BP 05/02/21 0902 (!) 191/131     Pulse Rate 05/02/21 0902 94     Resp 05/02/21 0902 17     Temp 05/02/21 0902 98.4 F (36.9 C)     Temp Source 05/02/21 0902 Oral     SpO2 05/02/21 0902 99 %     Weight 05/02/21 0905 200 lb (90.7 kg)     Height 05/02/21 0905 5' (1.524 m)     Head Circumference --      Peak Flow --      Pain Score 05/02/21 0905 10     Pain Loc --      Pain Edu? --      Excl. in GC? --     Constitutional: Alert and oriented. Eyes: Conjunctivae are normal. Head: Atraumatic. Nose: No congestion/rhinnorhea. Mouth/Throat: Mucous membranes are moist. Neck: Normal ROM Cardiovascular: Normal rate, regular rhythm. Grossly normal heart sounds. Respiratory: Normal respiratory effort.  No retractions. Lungs CTAB. Gastrointestinal: Soft and nontender. No distention. Genitourinary: Edema and fluctuance noted to left labia with purulent and bloody drainage noted. Musculoskeletal: No lower extremity tenderness nor edema. Neurologic:  Normal speech and language. No gross focal neurologic deficits are  appreciated. Skin:  Skin is warm, dry and intact. No rash noted.  Mild edema and induration just above gluteal cleft with no focal fluctuance, small skin opening with minimal purulent drainage to the right side of gluteal cleft. Psychiatric: Mood and affect are normal. Speech and behavior are normal.  ____________________________________________   LABS (all labs ordered are listed, but only abnormal results are displayed)  Labs Reviewed  COMPREHENSIVE METABOLIC PANEL - Abnormal; Notable for the following components:      Result Value   Glucose, Bld 113 (*)    Calcium 8.8 (*)    Albumin 3.0 (*)    AST 14 (*)    All other components within normal limits  CBC WITH DIFFERENTIAL/PLATELET - Abnormal; Notable for the following components:   Hemoglobin 10.5 (*)    HCT 34.9 (*)    MCV 71.5 (*)  MCH 21.5 (*)    RDW 19.1 (*)    Platelets 684 (*)    All other components within normal limits  LACTIC ACID, PLASMA  URINALYSIS, ROUTINE W REFLEX MICROSCOPIC  POC URINE PREG, ED    PROCEDURES  Procedure(s) performed (including Critical Care):  Procedures   ____________________________________________   INITIAL IMPRESSION / ASSESSMENT AND PLAN / ED COURSE      35 year old female with past medical history of hypertension presents to the ED complaining of painful swollen areas to her upper gluteal cleft as well as to her left labia.  She appears to have a pilonidal cyst that has begun draining on its own, small associated area of cellulitis but minimal remaining fluctuance at this time and I do not feel incision is needed.  On examination of her genital area, Bartholin's gland abscess seems to be draining on its own at this time and patient is not interested in incision and drainage here in the ED.  We will start her on antibiotics and she was counseled on warm compresses for her pilonidal abscess.  She was counseled to follow back up with [facility] general surgery for pilonidal abscess and was  given referral to OB/GYN for the Bartholin's gland abscess.  She was counseled to return to the ED for new worsening symptoms, patient agrees with plan.      ____________________________________________   FINAL CLINICAL IMPRESSION(S) / ED DIAGNOSES  Final diagnoses:  Bartholin's gland abscess  Pilonidal cyst     ED Discharge Orders          Ordered    cefdinir (OMNICEF) 300 MG capsule  2 times daily        05/02/21 1249    clindamycin (CLEOCIN) 300 MG capsule  3 times daily        05/02/21 1249    oxyCODONE-acetaminophen (PERCOCET) 5-325 MG tablet  Every 4 hours PRN        05/02/21 1250             Note:  This document was prepared using Dragon voice recognition software and may include unintentional dictation errors.    Chesley Noon, MD 05/02/21 1300

## 2021-05-02 NOTE — ED Notes (Signed)
This RN chaperoned MD Jessup's exam of this pt at this time. MD Larinda Buttery was witnessed to be clinical and professional during the entire exam, this RN was present for the entire exam.

## 2021-05-02 NOTE — ED Triage Notes (Signed)
First RN Note: Pt to ED via POV with c/o feeling sick, pt states hx of pilonidal cyst, states had one that ruptured several days ago, now feels "sick", pt states since then has had fever and nausea.

## 2022-06-18 ENCOUNTER — Other Ambulatory Visit: Payer: Self-pay

## 2022-06-18 ENCOUNTER — Emergency Department
Admission: EM | Admit: 2022-06-18 | Discharge: 2022-06-18 | Disposition: A | Discharge status: Home / Self Care | Payer: Self-pay | Attending: Student in an Organized Health Care Education/Training Program | Admitting: Student in an Organized Health Care Education/Training Program

## 2022-06-18 DIAGNOSIS — L02211 Cutaneous abscess of abdominal wall: Secondary | ICD-10-CM | POA: Insufficient documentation

## 2022-06-18 DIAGNOSIS — L0291 Cutaneous abscess, unspecified: Secondary | ICD-10-CM

## 2022-06-18 LAB — CBC WITH DIFFERENTIAL/PLATELET
Abs Immature Granulocytes: 0.04 10*3/uL (ref 0.00–0.07)
Basophils Absolute: 0 10*3/uL (ref 0.0–0.1)
Basophils Relative: 0 %
Eosinophils Absolute: 0.3 10*3/uL (ref 0.0–0.5)
Eosinophils Relative: 2 %
HCT: 34.6 % — ABNORMAL LOW (ref 36.0–46.0)
Hemoglobin: 10.1 g/dL — ABNORMAL LOW (ref 12.0–15.0)
Immature Granulocytes: 0 %
Lymphocytes Relative: 25 %
Lymphs Abs: 2.9 10*3/uL (ref 0.7–4.0)
MCH: 20.3 pg — ABNORMAL LOW (ref 26.0–34.0)
MCHC: 29.2 g/dL — ABNORMAL LOW (ref 30.0–36.0)
MCV: 69.6 fL — ABNORMAL LOW (ref 80.0–100.0)
Monocytes Absolute: 0.8 10*3/uL (ref 0.1–1.0)
Monocytes Relative: 7 %
Neutro Abs: 7.5 10*3/uL (ref 1.7–7.7)
Neutrophils Relative %: 66 %
Platelets: 613 10*3/uL — ABNORMAL HIGH (ref 150–400)
RBC: 4.97 MIL/uL (ref 3.87–5.11)
RDW: 20.7 % — ABNORMAL HIGH (ref 11.5–15.5)
Smear Review: NORMAL
WBC: 11.6 10*3/uL — ABNORMAL HIGH (ref 4.0–10.5)
nRBC: 0 % (ref 0.0–0.2)

## 2022-06-18 LAB — COMPREHENSIVE METABOLIC PANEL
ALT: 11 U/L (ref 0–44)
AST: 15 U/L (ref 15–41)
Albumin: 3.7 g/dL (ref 3.5–5.0)
Alkaline Phosphatase: 66 U/L (ref 38–126)
Anion gap: 9 (ref 5–15)
BUN: 8 mg/dL (ref 6–20)
CO2: 25 mmol/L (ref 22–32)
Calcium: 8.9 mg/dL (ref 8.9–10.3)
Chloride: 102 mmol/L (ref 98–111)
Creatinine, Ser: 0.51 mg/dL (ref 0.44–1.00)
GFR, Estimated: >60 mL/min (ref >60)
Glucose, Bld: 101 mg/dL — ABNORMAL HIGH (ref 70–99)
Potassium: 3.8 mmol/L (ref 3.5–5.1)
Sodium: 136 mmol/L (ref 135–145)
Total Bilirubin: 0.7 mg/dL (ref 0.3–1.2)
Total Protein: 7.5 g/dL (ref 6.5–8.1)

## 2022-06-18 LAB — URINALYSIS, ROUTINE W REFLEX MICROSCOPIC
Bilirubin Urine: NEGATIVE
Glucose, UA: NEGATIVE mg/dL
Ketones, ur: NEGATIVE mg/dL
Nitrite: NEGATIVE
Protein, ur: NEGATIVE mg/dL
Specific Gravity, Urine: 1.017 (ref 1.005–1.030)
pH: 7 (ref 5.0–8.0)

## 2022-06-18 LAB — HCG, QUANTITATIVE, PREGNANCY: hCG, Beta Chain, Quant, S: <1 m[IU]/mL (ref <5)

## 2022-06-18 MED ORDER — LIDOCAINE HCL (PF) 1 % IJ SOLN
5.0000 mL | Freq: Once | INTRAMUSCULAR | Status: AC
  Administered 2022-06-18: 5 mL via INTRADERMAL
  Filled 2022-06-18: qty 5

## 2022-06-18 MED ORDER — DIAZEPAM 5 MG PO TABS
5.0000 mg | ORAL_TABLET | Freq: Once | ORAL | Status: DC
  Filled 2022-06-18: qty 1

## 2022-06-18 MED ORDER — LIDOCAINE 4 % EX CREA
TOPICAL_CREAM | Freq: Once | CUTANEOUS | Status: AC
  Filled 2022-06-18: qty 5

## 2022-06-18 MED ORDER — SULFAMETHOXAZOLE-TRIMETHOPRIM 800-160 MG PO TABS
1.0000 | ORAL_TABLET | Freq: Once | ORAL | Status: AC
  Administered 2022-06-18: 1 via ORAL
  Filled 2022-06-18: qty 1

## 2022-06-18 MED ORDER — SULFAMETHOXAZOLE-TRIMETHOPRIM 800-160 MG PO TABS: 1.0000 | ORAL_TABLET | Freq: Two times a day (BID) | ORAL | 0 refills | Status: AC

## 2022-06-18 MED ORDER — OXYCODONE-ACETAMINOPHEN 5-325 MG PO TABS: 1.0000 | ORAL_TABLET | ORAL | 0 refills | Status: AC | PRN

## 2022-06-18 NOTE — ED Provider Notes (Signed)
[facility] Provider Note    Event Date/Time   First MD Initiated Contact with Patient 06/18/22 1126     (approximate)   History   Nausea   HPI  Maria Tapia is a 36 y.o. female recurrent abscesses presents to the ER for evaluation of pain to left upper quadrant boil area is developed over the past few days with overlying redness.  Feeling nauseated and feels like it is infected.  No recent antibiotics.  Denies any trauma.  No dysuria.  No chest pain or shortness of breath.     Physical Exam   Triage Vital Signs: ED Triage Vitals  Enc Vitals Group     BP 06/18/22 1113 107/89     Pulse Rate 06/18/22 1113 89     Resp 06/18/22 1113 18     Temp 06/18/22 1113 98.5 F (36.9 C)     Temp src --      SpO2 06/18/22 1113 100 %     Weight 06/18/22 1041 199 lb 15.3 oz (90.7 kg)     Height 06/18/22 1041 5' (1.524 m)     Head Circumference --      Peak Flow --      Pain Score 06/18/22 1117 10     Pain Loc --      Pain Edu? --      Excl. in GC? --     Most recent vital signs: Vitals:   06/18/22 1113  BP: 107/89  Pulse: 89  Resp: 18  Temp: 98.5 F (36.9 C)  SpO2: 100%     Constitutional: Alert  Eyes: Conjunctivae are normal.  Head: Atraumatic. Nose: No congestion/rhinnorhea. Mouth/Throat: Mucous membranes are moist.   Neck: Painless ROM.  Cardiovascular:   Good peripheral circulation. Respiratory: Normal respiratory effort.  No retractions.  Gastrointestinal: Soft and nontender.  Musculoskeletal:  no deformity Neurologic:  MAE spontaneously. No gross focal neurologic deficits are appreciated.  Skin:  Skin is warm, dry and intact.  Grape sized fluctuant abscess in the left upper quadrant below the bra line with some mild overlying erythema. Psychiatric: Mood and affect are normal. Speech and behavior are normal.    ED Results / Procedures / Treatments   Labs (all labs ordered are listed, but only abnormal results are displayed) Labs  Reviewed  CBC WITH DIFFERENTIAL/PLATELET - Abnormal; Notable for the following components:      Result Value   WBC 11.6 (*)    Hemoglobin 10.1 (*)    HCT 34.6 (*)    MCV 69.6 (*)    MCH 20.3 (*)    MCHC 29.2 (*)    RDW 20.7 (*)    Platelets 613 (*)    All other components within normal limits  COMPREHENSIVE METABOLIC PANEL - Abnormal; Notable for the following components:   Glucose, Bld 101 (*)    All other components within normal limits  URINALYSIS, ROUTINE W REFLEX MICROSCOPIC - Abnormal; Notable for the following components:   Color, Urine YELLOW (*)    APPearance CLOUDY (*)    Hgb urine dipstick SMALL (*)    Leukocytes,Ua MODERATE (*)    Bacteria, UA FEW (*)    All other components within normal limits  HCG, QUANTITATIVE, PREGNANCY     EKG     RADIOLOGY EMERGENCY DEPARTMENT US SOFT TISSUE INTERPRETATION "Study: Limited Soft Tissue Ultrasound"  INDICATIONS: Soft tissue infection Multiple views of the body part were obtained in real-time with a multi-frequency linear probe  PERFORMED BY: Myself IMAGES ARCHIVED?: No SIDE:Left BODY PART:Abdominal wall INTERPRETATION:  Abcess present     PROCEDURES:  Critical Care performed: No  ..Incision and Drainage  Date/Time: 06/18/2022 12:56 PM  Performed by: Merlyn Lot, MD Authorized by: Merlyn Lot, MD   Consent:    Consent obtained:  Verbal   Consent given by:  Patient   Risks discussed:  Bleeding, infection, incomplete drainage and pain   Alternatives discussed:  Alternative treatment, delayed treatment and observation Location:    Type:  Abscess   Location:  Trunk   Trunk location:  Abdomen Pre-procedure details:    Skin preparation:  Chlorhexidine with alcohol Anesthesia:    Anesthesia method:  Local infiltration   Local anesthetic:  Lidocaine 1% w/o epi Procedure type:    Complexity:  Simple Procedure details:    Ultrasound guidance: yes     Incision types:  Stab incision   Incision  depth:  Subcutaneous   Wound management:  Probed and deloculated   Drainage:  Purulent   Drainage amount:  Moderate   Wound treatment:  Wound left open   Packing materials:  None Post-procedure details:    Procedure completion:  Tolerated well, no immediate complications    MEDICATIONS ORDERED IN ED: Medications  diazepam (VALIUM) tablet 5 mg (5 mg Oral Not Given 06/18/22 1231)  lidocaine (LMX) 4 % cream ( Topical Given 06/18/22 1214)  lidocaine (PF) (XYLOCAINE) 1 % injection 5 mL (5 mLs Intradermal Given 06/18/22 1213)  sulfamethoxazole-trimethoprim (BACTRIM DS) 800-160 MG per tablet 1 tablet (1 tablet Oral Given 06/18/22 1309)     IMPRESSION / MDM / ASSESSMENT AND PLAN / ED COURSE  I reviewed the triage vital signs and the nursing notes.                              Differential diagnosis includes, but is not limited to, abscess, cellulitis, furuncle Kermode, ARB", mass, lymphadenitis, sepsis, dehydration  Patient presenting to the ER for evaluation symptoms as described above.  Physical exam consistent with abscess to the left upper abdominal wall.  Will I&D.  I&D performed with moderate purulent drainage.  Probed and deloculated left open.  Patient requested it not be packed given the size that is reasonable.  Patient placed on antibiotics for overlying cellulitis.  Does appear stable and appropriate for outpatient follow-up.       FINAL CLINICAL IMPRESSION(S) / ED DIAGNOSES   Final diagnoses:  Abscess     Rx / DC Orders   ED Discharge Orders          Ordered    sulfamethoxazole-trimethoprim (BACTRIM DS) 800-160 MG tablet  2 times daily        06/18/22 1256    oxyCODONE-acetaminophen (PERCOCET) 5-325 MG tablet  Every 4 hours PRN        06/18/22 1256             Note:  This document was prepared using Dragon voice recognition software and may include unintentional dictation errors.    Merlyn Lot, MD 06/18/22 [phone]

## 2022-06-18 NOTE — ED Provider Triage Note (Signed)
Emergency Medicine Provider Triage Evaluation Note  Maria Tapia , a 36 y.o. female  was evaluated in triage.  Pt complains of a "knot" to the left side of her belly.  She reports that she first noticed this in April or May of this year, but it was not painful until this week.  She denies fevers, but reports that she feels like she is getting sick.  Review of Systems  Positive: cyst Negative: fever  Physical Exam  Ht 5' (1.524 m)   Wt 90.7 kg   BMI 39.05 kg/m  Gen:   Awake, no distress   Resp:  Normal effort  MSK:   Moves extremities without difficulty  Other:  Unable to visualize fully in the waiting room due to location of her complaint and lack of privacy, plan for bedside US of area to assess for fluid collection and possible I+D  Medical Decision Making  Medically screening exam initiated at 11:04 AM.  Appropriate orders placed.  Homer Zabawa was informed that the remainder of the evaluation will be completed by another provider, this initial triage assessment does not replace that evaluation, and the importance of remaining in the ED until their evaluation is complete.     Jackelyn Hoehn, PA-C 06/18/22 1106

## 2022-06-18 NOTE — ED Triage Notes (Signed)
Pt to ED for nausea and also soft, movable cyst to L upper lateral abdomen since May but worse since couple day sago. No abdominal pain. No vomiting. 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

## 2023-05-09 ENCOUNTER — Other Ambulatory Visit: Payer: Self-pay

## 2023-05-09 ENCOUNTER — Emergency Department
Admission: EM | Admit: 2023-05-09 | Discharge: 2023-05-09 | Disposition: A | Discharge status: Home / Self Care | Payer: Self-pay | Attending: Emergency Medicine | Admitting: Emergency Medicine

## 2023-05-09 ENCOUNTER — Encounter: Payer: Self-pay | Admitting: Intensive Care

## 2023-05-09 DIAGNOSIS — I10 Essential (primary) hypertension: Secondary | ICD-10-CM | POA: Insufficient documentation

## 2023-05-09 DIAGNOSIS — D509 Iron deficiency anemia, unspecified: Secondary | ICD-10-CM | POA: Insufficient documentation

## 2023-05-09 LAB — COMPREHENSIVE METABOLIC PANEL
ALT: 11 U/L (ref 0–44)
AST: 13 U/L — ABNORMAL LOW (ref 15–41)
Albumin: 3.8 g/dL (ref 3.5–5.0)
Alkaline Phosphatase: 67 U/L (ref 38–126)
Anion gap: 6 (ref 5–15)
BUN: 11 mg/dL (ref 6–20)
CO2: 25 mmol/L (ref 22–32)
Calcium: 8.6 mg/dL — ABNORMAL LOW (ref 8.9–10.3)
Chloride: 105 mmol/L (ref 98–111)
Creatinine, Ser: 0.71 mg/dL (ref 0.44–1.00)
GFR, Estimated: >60 mL/min (ref >60)
Glucose, Bld: 114 mg/dL — ABNORMAL HIGH (ref 70–99)
Potassium: 3.5 mmol/L (ref 3.5–5.1)
Sodium: 136 mmol/L (ref 135–145)
Total Bilirubin: 0.4 mg/dL (ref <1.2)
Total Protein: 7.6 g/dL (ref 6.5–8.1)

## 2023-05-09 LAB — URINALYSIS, ROUTINE W REFLEX MICROSCOPIC
Bilirubin Urine: NEGATIVE
Glucose, UA: NEGATIVE mg/dL
Ketones, ur: NEGATIVE mg/dL
Leukocytes,Ua: NEGATIVE
Nitrite: NEGATIVE
Protein, ur: NEGATIVE mg/dL
Specific Gravity, Urine: 1.021 (ref 1.005–1.030)
pH: 6 (ref 5.0–8.0)

## 2023-05-09 LAB — CBC
HCT: 32.8 % — ABNORMAL LOW (ref 36.0–46.0)
Hemoglobin: 9.8 g/dL — ABNORMAL LOW (ref 12.0–15.0)
MCH: 19.8 pg — ABNORMAL LOW (ref 26.0–34.0)
MCHC: 29.9 g/dL — ABNORMAL LOW (ref 30.0–36.0)
MCV: 66.3 fL — ABNORMAL LOW (ref 80.0–100.0)
Platelets: 552 10*3/uL — ABNORMAL HIGH (ref 150–400)
RBC: 4.95 MIL/uL (ref 3.87–5.11)
RDW: 23.4 % — ABNORMAL HIGH (ref 11.5–15.5)
WBC: 7.8 10*3/uL (ref 4.0–10.5)
nRBC: 0 % (ref 0.0–0.2)

## 2023-05-09 LAB — TSH: TSH: 0.892 u[IU]/mL (ref 0.350–4.500)

## 2023-05-09 LAB — IRON AND TIBC
Iron: 17 ug/dL — ABNORMAL LOW (ref 28–170)
Saturation Ratios: 3 % — ABNORMAL LOW (ref 10.4–31.8)
TIBC: 528 ug/dL — ABNORMAL HIGH (ref 250–450)
UIBC: 511 ug/dL

## 2023-05-09 LAB — LIPASE, BLOOD: Lipase: 29 U/L (ref 11–51)

## 2023-05-09 LAB — T4, FREE: Free T4: 0.8 ng/dL (ref 0.61–1.12)

## 2023-05-09 LAB — FERRITIN: Ferritin: 6 ng/mL — ABNORMAL LOW (ref 11–307)

## 2023-05-09 MED ORDER — ALUM & MAG HYDROXIDE-SIMETH 200-200-20 MG/5ML PO SUSP
30.0000 mL | Freq: Once | ORAL | Status: AC
  Administered 2023-05-09: 30 mL via ORAL
  Filled 2023-05-09: qty 30

## 2023-05-09 MED ORDER — FERROUS SULFATE 325 (65 FE) MG PO TBEC: 325.0000 mg | DELAYED_RELEASE_TABLET | Freq: Three times a day (TID) | ORAL | 0 refills | Status: DC

## 2023-05-09 MED ORDER — ONDANSETRON 4 MG PO TBDP
8.0000 mg | ORAL_TABLET | Freq: Once | ORAL | Status: AC
  Administered 2023-05-09: 8 mg via ORAL
  Filled 2023-05-09: qty 2

## 2023-05-09 MED ORDER — FAMOTIDINE 20 MG PO TABS
40.0000 mg | ORAL_TABLET | Freq: Once | ORAL | Status: AC
  Administered 2023-05-09: 40 mg via ORAL
  Filled 2023-05-09: qty 2

## 2023-05-09 NOTE — ED Provider Notes (Signed)
[facility] Provider Note    Event Date/Time   First MD Initiated Contact with Patient 05/09/23 1050     (approximate)   History   Chief Complaint: Emesis and Abdominal Pain   HPI  Maria Tapia is a 37 y.o. female with a history of hypertension, depression who comes ED complaining of fatigue and ice craving for the past year.  Last night after eating dinner, she also developed some generalized abdominal discomfort and vomited 1 time overnight.  No diarrhea or constipation.  She does note that this morning after a prolonged period of straining on the toilet to have a bowel movement she noticed a few small spots of blood without substantial bleeding.  No other acute complaints.  No fever.  No chest pain or shortness of breath.          Physical Exam   Triage Vital Signs: ED Triage Vitals  Encounter Vitals Group     BP 05/09/23 1003 (!) 190/117     Systolic BP Percentile --      Diastolic BP Percentile --      Pulse Rate 05/09/23 1003 95     Resp 05/09/23 1003 18     Temp 05/09/23 1003 98.4 F (36.9 C)     Temp Source 05/09/23 1003 Oral     SpO2 05/09/23 1003 98 %     Weight 05/09/23 1004 230 lb (104.3 kg)     Height 05/09/23 1004 4\' 11"  (1.499 m)     Head Circumference --      Peak Flow --      Pain Score 05/09/23 1004 9     Pain Loc --      Pain Education --      Exclude from Growth Chart --     Most recent vital signs: Vitals:   05/09/23 1003  BP: (!) 190/117  Pulse: 95  Resp: 18  Temp: 98.4 F (36.9 C)  SpO2: 98%    General: Awake, no distress.  CV:  Good peripheral perfusion.  Resp:  Normal effort.  Abd:  No distention.  Soft without focal tenderness Other:  Moist oral mucosa.  Diffuse fullness around the lower neck   ED Results / Procedures / Treatments   Labs (all labs ordered are listed, but only abnormal results are displayed) Labs Reviewed  COMPREHENSIVE METABOLIC PANEL - Abnormal; Notable for the following  components:      Result Value   Glucose, Bld 114 (*)    Calcium 8.6 (*)    AST 13 (*)    All other components within normal limits  CBC - Abnormal; Notable for the following components:   Hemoglobin 9.8 (*)    HCT 32.8 (*)    MCV 66.3 (*)    MCH 19.8 (*)    MCHC 29.9 (*)    RDW 23.4 (*)    Platelets 552 (*)    All other components within normal limits  URINALYSIS, ROUTINE W REFLEX MICROSCOPIC - Abnormal; Notable for the following components:   Color, Urine YELLOW (*)    APPearance HAZY (*)    Hgb urine dipstick MODERATE (*)    Bacteria, UA RARE (*)    All other components within normal limits  IRON AND TIBC - Abnormal; Notable for the following components:   Iron 17 (*)    TIBC 528 (*)    Saturation Ratios 3 (*)    All other components within normal limits  FERRITIN - Abnormal; Notable  for the following components:   Ferritin 6 (*)    All other components within normal limits  LIPASE, BLOOD  TSH  T4, FREE     EKG    RADIOLOGY    PROCEDURES:  Procedures   MEDICATIONS ORDERED IN ED: Medications  ondansetron (ZOFRAN-ODT) disintegrating tablet 8 mg (8 mg Oral Given 05/09/23 1142)  famotidine (PEPCID) tablet 40 mg (40 mg Oral Given 05/09/23 1143)  alum & mag hydroxide-simeth (MAALOX/MYLANTA) 200-200-20 MG/5ML suspension 30 mL (30 mLs Oral Given 05/09/23 1143)     IMPRESSION / MDM / ASSESSMENT AND PLAN / ED COURSE  I reviewed the triage vital signs and the nursing notes.  DDx: Anemia, iron deficiency, AKI, electrolyte abnormality, UTI, gastritis, pancreatitis, hypothyroidism  Patient's presentation is most consistent with acute presentation with potential threat to life or bodily function.  Patient presents with chronic fatigue and ice cravings.  Initial labs unremarkable except for stable anemia, microcytic.  Will add on iron studies along with TSH.  Awaiting urinalysis.  Will give antacids and Zofran and p.o. trial   Considering the patient's symptoms,  medical history, and physical examination today, I have low suspicion for cholecystitis or biliary pathology, pancreatitis, perforation or bowel obstruction, hernia, intra-abdominal abscess, AAA or dissection, volvulus or intussusception, mesenteric ischemia, or appendicitis.   ----------------------------------------- 12:50 PM on 05/09/2023 ----------------------------------------- Labs confirm iron deficiency.  Otherwise reassuring.  Stable for discharge       FINAL CLINICAL IMPRESSION(S) / ED DIAGNOSES   Final diagnoses:  Iron deficiency anemia, unspecified iron deficiency anemia type     Rx / DC Orders   ED Discharge Orders          Ordered    ferrous sulfate 325 (65 FE) MG EC tablet  3 times daily with meals        05/09/23 1235             Note:  This document was prepared using Dragon voice recognition software and may include unintentional dictation errors.   Sharman Cheek, MD 05/09/23 1250

## 2023-05-09 NOTE — ED Triage Notes (Signed)
Patient c/o abdominal pain, emesis X1, and rectal bleeding with small clots.  Denies hemorrhoids.   Smokes cigarettes

## 2023-11-30 ENCOUNTER — Other Ambulatory Visit: Payer: Self-pay

## 2023-11-30 ENCOUNTER — Emergency Department: Payer: Self-pay

## 2023-11-30 ENCOUNTER — Emergency Department
Admission: EM | Admit: 2023-11-30 | Discharge: 2023-11-30 | Disposition: A | Discharge status: Home / Self Care | Payer: Self-pay | Attending: Emergency Medicine | Admitting: Emergency Medicine

## 2023-11-30 DIAGNOSIS — F17200 Nicotine dependence, unspecified, uncomplicated: Secondary | ICD-10-CM | POA: Insufficient documentation

## 2023-11-30 DIAGNOSIS — Z79899 Other long term (current) drug therapy: Secondary | ICD-10-CM | POA: Insufficient documentation

## 2023-11-30 DIAGNOSIS — L03116 Cellulitis of left lower limb: Secondary | ICD-10-CM | POA: Insufficient documentation

## 2023-11-30 DIAGNOSIS — R600 Localized edema: Secondary | ICD-10-CM

## 2023-11-30 DIAGNOSIS — I10 Essential (primary) hypertension: Secondary | ICD-10-CM | POA: Insufficient documentation

## 2023-11-30 LAB — CBC
HCT: 33.1 % — ABNORMAL LOW (ref 36.0–46.0)
Hemoglobin: 10 g/dL — ABNORMAL LOW (ref 12.0–15.0)
MCH: 21.1 pg — ABNORMAL LOW (ref 26.0–34.0)
MCHC: 30.2 g/dL (ref 30.0–36.0)
MCV: 70 fL — ABNORMAL LOW (ref 80.0–100.0)
Platelets: 588 10*3/uL — ABNORMAL HIGH (ref 150–400)
RBC: 4.73 MIL/uL (ref 3.87–5.11)
RDW: 19.4 % — ABNORMAL HIGH (ref 11.5–15.5)
WBC: 8.4 10*3/uL (ref 4.0–10.5)
nRBC: 0 % (ref 0.0–0.2)

## 2023-11-30 LAB — BASIC METABOLIC PANEL WITH GFR
Anion gap: 9 (ref 5–15)
BUN: 10 mg/dL (ref 6–20)
CO2: 24 mmol/L (ref 22–32)
Calcium: 8.9 mg/dL (ref 8.9–10.3)
Chloride: 103 mmol/L (ref 98–111)
Creatinine, Ser: 0.66 mg/dL (ref 0.44–1.00)
GFR, Estimated: >60 mL/min (ref >60)
Glucose, Bld: 125 mg/dL — ABNORMAL HIGH (ref 70–99)
Potassium: 3.4 mmol/L — ABNORMAL LOW (ref 3.5–5.1)
Sodium: 136 mmol/L (ref 135–145)

## 2023-11-30 LAB — BRAIN NATRIURETIC PEPTIDE: B Natriuretic Peptide: 82.2 pg/mL (ref 0.0–100.0)

## 2023-11-30 MED ORDER — LOSARTAN POTASSIUM 50 MG PO TABS
50.0000 mg | ORAL_TABLET | Freq: Every day | ORAL | 3 refills | Status: AC
  Filled 2023-11-30 – 2024-01-01 (×2): qty 30, 30d supply, fill #0
  Filled 2024-01-30 – 2024-02-14 (×3): qty 30, 30d supply, fill #1

## 2023-11-30 MED ORDER — AMLODIPINE BESYLATE 5 MG PO TABS
5.0000 mg | ORAL_TABLET | Freq: Every day | ORAL | 3 refills | Status: AC
  Filled 2023-11-30: qty 30, 30d supply, fill #0
  Filled 2024-01-01: qty 30, 30d supply, fill #1
  Filled 2024-01-30 – 2024-02-14 (×3): qty 30, 30d supply, fill #2

## 2023-11-30 MED ORDER — FERROUS SULFATE 325 (65 FE) MG PO TBEC
325.0000 mg | DELAYED_RELEASE_TABLET | Freq: Every day | ORAL | 3 refills | Status: AC
  Filled 2023-11-30 – 2024-02-14 (×2): qty 30, 30d supply, fill #0

## 2023-11-30 MED ORDER — AMLODIPINE BESYLATE 5 MG PO TABS
5.0000 mg | ORAL_TABLET | Freq: Once | ORAL | Status: AC
  Administered 2023-11-30: 5 mg via ORAL
  Filled 2023-11-30: qty 1

## 2023-11-30 MED ORDER — HYDROCHLOROTHIAZIDE 25 MG PO TABS
25.0000 mg | ORAL_TABLET | Freq: Every day | ORAL | Status: DC
  Administered 2023-11-30: 25 mg via ORAL
  Filled 2023-11-30: qty 1

## 2023-11-30 MED ORDER — DOXYCYCLINE HYCLATE 100 MG PO TABS
100.0000 mg | ORAL_TABLET | Freq: Once | ORAL | Status: AC
  Administered 2023-11-30: 100 mg via ORAL
  Filled 2023-11-30: qty 1

## 2023-11-30 MED ORDER — HYDROCHLOROTHIAZIDE 25 MG PO TABS
25.0000 mg | ORAL_TABLET | Freq: Every day | ORAL | 3 refills | Status: AC
  Filled 2023-11-30: qty 30, 30d supply, fill #0
  Filled 2024-01-01: qty 30, 30d supply, fill #1
  Filled 2024-01-30 – 2024-02-14 (×3): qty 30, 30d supply, fill #2

## 2023-11-30 MED ORDER — DOXYCYCLINE HYCLATE 100 MG PO TABS
100.0000 mg | ORAL_TABLET | Freq: Two times a day (BID) | ORAL | 0 refills | Status: DC
  Filled 2023-11-30: qty 20, 10d supply, fill #0

## 2023-11-30 NOTE — ED Provider Notes (Signed)
 [facility] Emergency Department Provider Note     Event Date/Time   First MD Initiated Contact with Patient 11/30/23 1505     (approximate)   History   Leg Swelling   HPI  Maria Tapia is a 38 y.o. female with a history of HTN and iron deficiency anemia, presents to the ED for evaluation of increased swelling to the left foot, above baseline. She reports pain without known injury. She is also reporting some bruising and discoloration to the foot.  Patient reports she has been taking her losartan  twice daily to help manage her poorly controlled blood pressure.  She recently establish care with [facility], and is scheduled to see the clinic and a few months.  She denies any chest pain or shortness of breath.  She is unaware of any injury or trauma to the foot.  She denies any known insect bite or allergy.  She reports pain with bearing weight through the foot due to the dorsal swelling.  Patient works at home, doing computer work primarily.  She would endorse that her leg swelled significantly throughout the day, but overnight and in the morning when she is lying flat, the swelling resolves until she returns to her desk work.  Physical Exam   Triage Vital Signs: ED Triage Vitals  Encounter Vitals Group     BP 11/30/23 1423 (!) 158/136     Systolic BP Percentile --      Diastolic BP Percentile --      Pulse Rate 11/30/23 1423 99     Resp 11/30/23 1423 20     Temp 11/30/23 1423 98.1 F (36.7 C)     Temp Source 11/30/23 1423 Oral     SpO2 11/30/23 1423 100 %     Weight --      Height --      Head Circumference --      Peak Flow --      Pain Score 11/30/23 1422 10     Pain Loc --      Pain Education --      Exclude from Growth Chart --     Most recent vital signs: Vitals:   11/30/23 1423  BP: (!) 158/136  Pulse: 99  Resp: 20  Temp: 98.1 F (36.7 C)  SpO2: 100%    General Awake, no distress.  NAD HEENT NCAT. PERRL. EOMI.  exophthalmos noted bilaterally no rhinorrhea. Mucous membranes are moist.  CV:  Good peripheral perfusion. RRR.  Bilateral 2+ pitting limited to the lower extremities RESP:  Normal effort. CTA ABD:  No distention.  MSK:  ROM of all extremities.  Left foot with an area about the size of a quarter, under the dorsal foot at the ankle, showing some local erythema and slight induration.  No fluctuance is appreciated.  Some expansion of the erythema noted medially.  Ankle exam is normal.  Foot exam is reassuring otherwise.   ED Results / Procedures / Treatments   Labs (all labs ordered are listed, but only abnormal results are displayed) Labs Reviewed  CBC - Abnormal; Notable for the following components:      Result Value   Hemoglobin 10.0 (*)    HCT 33.1 (*)    MCV 70.0 (*)    MCH 21.1 (*)    RDW 19.4 (*)    Platelets 588 (*)    All other components within normal limits  BASIC METABOLIC PANEL WITH GFR - Abnormal; Notable for  the following components:   Potassium 3.4 (*)    Glucose, Bld 125 (*)    All other components within normal limits     EKG   RADIOLOGY  I personally viewed and evaluated these images as part of my medical decision making, as well as reviewing the written report by the radiologist.  ED Provider Interpretation: No evidence of DVT  US  Venous Img Lower Unilateral Left Result Date: 11/30/2023 CLINICAL DATA:  Bilateral lower extremity redness, pain left greater than right EXAM: LEFT LOWER EXTREMITY VENOUS DOPPLER ULTRASOUND TECHNIQUE: Gray-scale sonography with compression, as well as color and duplex ultrasound, were performed to evaluate the deep venous system(s) from the level of the common femoral vein through the popliteal and proximal calf veins. COMPARISON:  None Available. FINDINGS: VENOUS Normal compressibility of the common femoral, superficial femoral, and popliteal veins, as well as the visualized calf veins. Visualized portions of profunda femoral vein and  great saphenous vein unremarkable. No filling defects to suggest DVT on grayscale or color Doppler imaging. Doppler waveforms show normal direction of venous flow, normal respiratory plasticity and response to augmentation. Limited views of the contralateral common femoral vein are unremarkable. OTHER None. Limitations: none IMPRESSION: Negative. Electronically Signed   By: Janeece Mechanic M.D.   On: 11/30/2023 18:09     PROCEDURES:  Critical Care performed: No  Procedures   MEDICATIONS ORDERED IN ED: Medications  hydrochlorothiazide (HYDRODIURIL) tablet 25 mg (25 mg Oral Given 11/30/23 1643)  doxycycline  (VIBRA -TABS) tablet 100 mg (has no administration in time range)  amLODipine (NORVASC) tablet 5 mg (5 mg Oral Given 11/30/23 1643)     IMPRESSION / MDM / ASSESSMENT AND PLAN / ED COURSE  I reviewed the triage vital signs and the nursing notes.                              Differential diagnosis includes, but is not limited to, DVT, cellulitis, peripheral edema, hematoma, joint effusion  Patient's presentation is most consistent with acute complicated illness / injury requiring diagnostic workup.  Patient's diagnosis is consistent with likely cellulitis to the dorsal lateral left foot.  Patient with chronic bilateral lower extremity edema, presents with acute tenderness and erythema to the dorsal foot.  No clear etiology is established.  Symptoms present are most consistent with a nonpurulent cellulitis.  Patient will be discharged home with prescriptions for doxycycline  given her significant PCN allergy.  Patient will also be given courtesy refills of her blood pressure medicine losartan , along with amlodipine and HCTZ.  Patient is advised to monitor her blood pressure daily, and monitor her weight weekly.  Patient is to follow up with her primary provider as discussed, as needed or otherwise directed. Patient is given ED precautions to return to the ED for any worsening or new  symptoms.   FINAL CLINICAL IMPRESSION(S) / ED DIAGNOSES   Final diagnoses:  Peripheral edema  Cellulitis of left lower extremity  Poorly-controlled hypertension     Rx / DC Orders   ED Discharge Orders          Ordered    amLODipine (NORVASC) 5 MG tablet  Daily        11/30/23 1643    losartan  (COZAAR ) 50 MG tablet  Daily        11/30/23 1643    hydrochlorothiazide (HYDRODIURIL) 25 MG tablet  Daily        11/30/23 1643    ferrous  sulfate 325 (65 FE) MG EC tablet  Daily with breakfast        11/30/23 1643    doxycycline  (VIBRA -TABS) 100 MG tablet  2 times daily        11/30/23 1827             Note:  This document was prepared using Dragon voice recognition software and may include unintentional dictation errors.    May Sparks, PA-C 11/30/23 1841    Lubertha Rush, MD 12/01/23 [phone]

## 2023-11-30 NOTE — Discharge Instructions (Addendum)
 Your exam, labs, and ultrasound are normal and reassuring at this time.  No signs of any blood clot to the left lower leg.  You are being treated for a cellulitis with an antibiotic.  You have also had your blood pressure medicines updated and refilled.  You should monitor and track your blood pressure daily.  I would also recommend measuring your weight weekly.  You should rest with the legs elevated when seated and consider buying compression socks which you can local drugstore.  Follow-up with your primary provider as discussed.  Return to the ED for worsening symptoms.

## 2023-11-30 NOTE — ED Triage Notes (Signed)
 Pt to ED via POV from home. Pt ambulatory to triage and place din wheelchair. Pt reports baseline swelling in feet. Pt reports increased swelling in left foot that is now painful. Pt also has discoloration noted to foot and pain in that area.   Pt with hx of HTN. Pt everyday smoker.

## 2023-12-01 ENCOUNTER — Other Ambulatory Visit: Payer: Self-pay

## 2023-12-02 ENCOUNTER — Other Ambulatory Visit: Payer: Self-pay

## 2023-12-12 ENCOUNTER — Other Ambulatory Visit: Payer: Self-pay

## 2024-01-01 ENCOUNTER — Other Ambulatory Visit: Payer: Self-pay

## 2024-01-30 ENCOUNTER — Other Ambulatory Visit: Payer: Self-pay

## 2024-02-10 ENCOUNTER — Other Ambulatory Visit: Payer: Self-pay

## 2024-02-14 ENCOUNTER — Other Ambulatory Visit: Payer: Self-pay

## 2024-02-27 ENCOUNTER — Other Ambulatory Visit: Payer: Self-pay

## 2024-06-19 ENCOUNTER — Other Ambulatory Visit: Payer: Self-pay

## 2024-06-19 ENCOUNTER — Encounter: Payer: Self-pay | Admitting: Emergency Medicine

## 2024-06-19 ENCOUNTER — Emergency Department
Admission: EM | Admit: 2024-06-19 | Discharge: 2024-06-19 | Disposition: A | Discharge status: Home / Self Care | Payer: Self-pay | Attending: Emergency Medicine | Admitting: Emergency Medicine

## 2024-06-19 ENCOUNTER — Emergency Department: Payer: Self-pay

## 2024-06-19 DIAGNOSIS — R030 Elevated blood-pressure reading, without diagnosis of hypertension: Secondary | ICD-10-CM | POA: Insufficient documentation

## 2024-06-19 DIAGNOSIS — F17210 Nicotine dependence, cigarettes, uncomplicated: Secondary | ICD-10-CM | POA: Insufficient documentation

## 2024-06-19 DIAGNOSIS — J101 Influenza due to other identified influenza virus with other respiratory manifestations: Secondary | ICD-10-CM | POA: Insufficient documentation

## 2024-06-19 DIAGNOSIS — I10 Essential (primary) hypertension: Secondary | ICD-10-CM | POA: Insufficient documentation

## 2024-06-19 LAB — RESP PANEL BY RT-PCR (RSV, FLU A&B, COVID)  RVPGX2
Influenza A by PCR: POSITIVE — AB
Influenza B by PCR: NEGATIVE
Resp Syncytial Virus by PCR: NEGATIVE
SARS Coronavirus 2 by RT PCR: NEGATIVE

## 2024-06-19 MED ORDER — PROMETHAZINE-DM 6.25-15 MG/5ML PO SYRP: 5.0000 mL | ORAL_SOLUTION | Freq: Four times a day (QID) | ORAL | 0 refills | Status: DC | PRN

## 2024-06-19 MED ORDER — PROMETHAZINE-DM 6.25-15 MG/5ML PO SYRP: 5.0000 mL | ORAL_SOLUTION | Freq: Four times a day (QID) | ORAL | 0 refills | Status: AC | PRN

## 2024-06-19 MED ORDER — ACETAMINOPHEN 500 MG PO TABS
1000.0000 mg | ORAL_TABLET | Freq: Once | ORAL | Status: AC
  Administered 2024-06-19: 1000 mg via ORAL
  Filled 2024-06-19: qty 2

## 2024-06-19 NOTE — ED Notes (Signed)
 See triage note  Presents with cough and congestion   Also having some ear pain and some SOB Afebrile on arrival

## 2024-06-19 NOTE — Discharge Instructions (Addendum)
 Follow with your primary care provider if any continued problems.  A prescription for cough medication was sent to the pharmacy for you to take as needed.  Continue Tylenol  and ibuprofen  as needed for body aches, headache, fever.  Increase fluids to stay hydrated. Keep your appointment with your primary care provider in January to have your blood pressure rechecked and continue with your prescribed blood pressure medication.

## 2024-06-19 NOTE — ED Triage Notes (Signed)
 Pt reports cough, congestion, ear pain, and SHOB x 1 week.

## 2024-06-19 NOTE — ED Provider Notes (Signed)
 "  [facility] Provider Note    Event Date/Time   First MD Initiated Contact with Patient 06/19/24 [phone]     (approximate)   History   Cough   HPI  Maria Tapia is a 38 y.o. female presents to the ED with complaint of cough, congestion, ear pain for 1 week.  She also states she has had some shortness of breath since feeling sick.  No over-the-counter medication has been taken for her cough.  Patient has a history of hypertension and continues to smoke 1 pack cigarettes per day.     Physical Exam   Triage Vital Signs: ED Triage Vitals  Encounter Vitals Group     BP 06/19/24 0837 (!) 146/117     Girls Systolic BP Percentile --      Girls Diastolic BP Percentile --      Boys Systolic BP Percentile --      Boys Diastolic BP Percentile --      Pulse Rate 06/19/24 0837 91     Resp 06/19/24 0837 20     Temp 06/19/24 0837 98.4 F (36.9 C)     Temp Source 06/19/24 0837 Oral     SpO2 06/19/24 0837 99 %     Weight 06/19/24 0957 229 lb 15 oz (104.3 kg)     Height 06/19/24 0957 4' 11 (1.499 m)     Head Circumference --      Peak Flow --      Pain Score --      Pain Loc --      Pain Education --      Exclude from Growth Chart --     Most recent vital signs: Vitals:   06/19/24 0837 06/19/24 1016  BP: (!) 146/117 (!) 130/94  Pulse: 91   Resp: 20   Temp: 98.4 F (36.9 C)   SpO2: 99%      General: Awake, no distress.  Alert, talkative, able answer questions in complete sentences, nontoxic in appearance. CV:  Good peripheral perfusion.  Heart regular rate rhythm. Resp:  Normal effort.  Lungs clear bilaterally. Abd:  No distention.  Other:     ED Results / Procedures / Treatments   Labs (all labs ordered are listed, but only abnormal results are displayed) Labs Reviewed  RESP PANEL BY RT-PCR (RSV, FLU A&B, COVID)  RVPGX2 - Abnormal; Notable for the following components:      Result Value   Influenza A by PCR POSITIVE (*)    All other  components within normal limits     RADIOLOGY Chest x-ray images were reviewed and interpreted by myself independent of radiologist and no pneumonia was noted.  Official radiology report is negative for acute cardiopulmonary findings.    PROCEDURES:  Critical Care performed:   Procedures   MEDICATIONS ORDERED IN ED: Medications  acetaminophen  (TYLENOL ) tablet 1,000 mg (1,000 mg Oral Given 06/19/24 1016)     IMPRESSION / MDM / ASSESSMENT AND PLAN / ED COURSE  I reviewed the triage vital signs and the nursing notes.   Differential diagnosis includes, but is not limited to, viral illness, COVID, influenza, RSV, pneumonia.  39 year old female presents to the ED with flulike symptoms.  Patient was made aware that her test was positive for influenza A and her chest x-ray was normal.  Patient was given a prescription for promethazine  dextromethorphan as needed for cough and congestion.  Initially blood pressure was elevated however on her second blood pressure prior to discharge  her blood pressure was down to 130/94.  She is still encouraged to follow-up with her PCP and states that she already has an appointment for 1 January.      Patient's presentation is most consistent with acute complicated illness / injury requiring diagnostic workup.  FINAL CLINICAL IMPRESSION(S) / ED DIAGNOSES   Final diagnoses:  Influenza A  Elevated blood pressure reading     Rx / DC Orders   ED Discharge Orders          Ordered    promethazine -dextromethorphan (PROMETHAZINE -DM) 6.25-15 MG/5ML syrup  4 times daily PRN,   Status:  Discontinued        06/19/24 1007    promethazine -dextromethorphan (PROMETHAZINE -DM) 6.25-15 MG/5ML syrup  4 times daily PRN        06/19/24 1017             Note:  This document was prepared using Dragon voice recognition software and may include unintentional dictation errors.   Saunders Shona CROME, PA-C 06/19/24 1406    Jossie Artist POUR, MD 06/21/24  [phone]  "

## 2024-06-25 ENCOUNTER — Other Ambulatory Visit: Payer: Self-pay

## 2024-06-25 ENCOUNTER — Emergency Department
Admission: EM | Admit: 2024-06-25 | Discharge: 2024-06-25 | Disposition: A | Discharge status: Home / Self Care | Payer: Self-pay

## 2024-06-25 DIAGNOSIS — R519 Headache, unspecified: Secondary | ICD-10-CM

## 2024-06-25 DIAGNOSIS — I10 Essential (primary) hypertension: Secondary | ICD-10-CM | POA: Insufficient documentation

## 2024-06-25 DIAGNOSIS — K047 Periapical abscess without sinus: Secondary | ICD-10-CM | POA: Insufficient documentation

## 2024-06-25 LAB — COMPREHENSIVE METABOLIC PANEL WITH GFR
ALT: 11 U/L (ref 0–44)
AST: 19 U/L (ref 15–41)
Albumin: 3.9 g/dL (ref 3.5–5.0)
Alkaline Phosphatase: 64 U/L (ref 38–126)
Anion gap: 9 (ref 5–15)
BUN: 12 mg/dL (ref 6–20)
CO2: 29 mmol/L (ref 22–32)
Calcium: 9.3 mg/dL (ref 8.9–10.3)
Chloride: 100 mmol/L (ref 98–111)
Creatinine, Ser: 0.73 mg/dL (ref 0.44–1.00)
GFR, Estimated: >60 mL/min
Glucose, Bld: 98 mg/dL (ref 70–99)
Potassium: 3.9 mmol/L (ref 3.5–5.1)
Sodium: 138 mmol/L (ref 135–145)
Total Bilirubin: 0.3 mg/dL (ref 0.0–1.2)
Total Protein: 7.1 g/dL (ref 6.5–8.1)

## 2024-06-25 LAB — CBC
HCT: 43.8 % (ref 36.0–46.0)
Hemoglobin: 14.3 g/dL (ref 12.0–15.0)
MCH: 26.9 pg (ref 26.0–34.0)
MCHC: 32.6 g/dL (ref 30.0–36.0)
MCV: 82.3 fL (ref 80.0–100.0)
Platelets: 479 K/uL — ABNORMAL HIGH (ref 150–400)
RBC: 5.32 MIL/uL — ABNORMAL HIGH (ref 3.87–5.11)
RDW: 18.2 % — ABNORMAL HIGH (ref 11.5–15.5)
WBC: 7.9 K/uL (ref 4.0–10.5)
nRBC: 0 % (ref 0.0–0.2)

## 2024-06-25 LAB — LACTIC ACID, PLASMA: Lactic Acid, Venous: 1.5 mmol/L (ref 0.5–1.9)

## 2024-06-25 MED ORDER — KETOROLAC TROMETHAMINE 10 MG PO TABS: 10.0000 mg | ORAL_TABLET | Freq: Four times a day (QID) | ORAL | 0 refills | Status: AC | PRN

## 2024-06-25 MED ORDER — CLINDAMYCIN HCL 150 MG PO CAPS: 450.0000 mg | ORAL_CAPSULE | Freq: Three times a day (TID) | ORAL | 0 refills | Status: AC

## 2024-06-25 MED ORDER — CLINDAMYCIN HCL 150 MG PO CAPS
450.0000 mg | ORAL_CAPSULE | Freq: Once | ORAL | Status: AC
  Administered 2024-06-25: 450 mg via ORAL
  Filled 2024-06-25: qty 3

## 2024-06-25 MED ORDER — OXYCODONE-ACETAMINOPHEN 5-325 MG PO TABS
1.0000 | ORAL_TABLET | Freq: Once | ORAL | Status: AC
  Administered 2024-06-25: 1 via ORAL
  Filled 2024-06-25: qty 1

## 2024-06-25 MED ORDER — ACETAMINOPHEN 325 MG PO TABS
650.0000 mg | ORAL_TABLET | Freq: Once | ORAL | Status: AC
  Administered 2024-06-25: 650 mg via ORAL
  Filled 2024-06-25: qty 2

## 2024-06-25 NOTE — ED Provider Notes (Signed)
 "  [facility] Provider Note    Event Date/Time   First MD Initiated Contact with Patient 06/25/24 [phone]     (approximate)   History   Facial Pain   HPI  Teira Kunin is a 39 y.o. female with a past medical history of hypertension, right-sided lazy eye, presenting to the emergency department complaining of 1 month of left-sided ear and facial pain.  Patient reports that she is concerned that it may be a dental infection but is uncertain what tooth is infected because she states it moves around.  She reports that the pain has been worsening and so she decided to come to the ER for evaluation.  She reports that it seems to start in her ear and radiates throughout the whole left side of her face.  She denies any eye swelling, vision changes, rash, or facial swelling.  The patient does report that she a piercing to her upper left ear several years ago that became infected and she has since then had significant pain with some swelling and discharge to the ear and is concerned that this may be related.     Physical Exam   Triage Vital Signs: ED Triage Vitals  Encounter Vitals Group     BP 06/25/24 0901 (!) 125/105     Girls Systolic BP Percentile --      Girls Diastolic BP Percentile --      Boys Systolic BP Percentile --      Boys Diastolic BP Percentile --      Pulse Rate 06/25/24 0859 90     Resp 06/25/24 0859 18     Temp 06/25/24 0859 97.9 F (36.6 C)     Temp Source 06/25/24 0859 Oral     SpO2 06/25/24 0859 98 %     Weight --      Height --      Head Circumference --      Peak Flow --      Pain Score 06/25/24 0858 10     Pain Loc --      Pain Education --      Exclude from Growth Chart --     Most recent vital signs: Vitals:   06/25/24 0859 06/25/24 0901  BP:  (!) 125/105  Pulse: 90   Resp: 18   Temp: 97.9 F (36.6 C)   SpO2: 98%      General: Awake, no distress.  CV:  Good peripheral perfusion.  Resp:  Normal effort.  Abd:  No  distention.  Other:  Significant tenderness to palpation with skin breakdown and exposed cartilage of the helix and superior crus of the left ear with no active drainage   ED Results / Procedures / Treatments   Labs (all labs ordered are listed, but only abnormal results are displayed) Labs Reviewed  CBC  COMPREHENSIVE METABOLIC PANEL WITH GFR  LACTIC ACID, PLASMA     EKG     RADIOLOGY     PROCEDURES:  Critical Care performed: No  Procedures   MEDICATIONS ORDERED IN ED: Medications  oxyCODONE -acetaminophen  (PERCOCET/ROXICET) 5-325 MG per tablet 1 tablet (has no administration in time range)  acetaminophen  (TYLENOL ) tablet 650 mg (650 mg Oral Given 06/25/24 0901)     IMPRESSION / MDM / ASSESSMENT AND PLAN / ED COURSE  I reviewed the triage vital signs and the nursing notes.  Differential diagnosis includes, but is not limited to, dental infection, otitis media, otitis externa, TMJ  Patient's presentation is most consistent with acute complicated illness / injury requiring diagnostic workup.  Patient is a 39 year old female with a past medical history of hypertension, right lazy eye, presenting to the emergency department complaining of left-sided ear and facial pain for the last month.  Patient is noted to have significant skin breakdown with exposed cartilage to the helix and superior crus of the left ear that she reports is from a previously infected piercing several years ago.  Lab work was performed and was overall unremarkable with no findings concerning for ongoing infection.  I discussed with the patient at length her symptoms and results.  Discussed concern for possible dental infection given her poor dentition and multiple caries as well as the possibility of TMJ being the source of her symptoms.  Discussed that I do not believe that her symptoms are related to the breakdown of her skin on the ear as this has been ongoing for several  years and is unchanged.  Discussed plan for treatment with clindamycin  as well as with Toradol  for pain.  Advised her to follow-up with her primary care physician as well as with a dentist for further evaluation.  She will return to the emergency department for new or worsening symptoms peer      FINAL CLINICAL IMPRESSION(S) / ED DIAGNOSES   Final diagnoses:  Left facial pain  Dental infection     Rx / DC Orders   ED Discharge Orders          Ordered    clindamycin  (CLEOCIN ) 150 MG capsule  3 times daily        06/25/24 1109    ketorolac  (TORADOL ) 10 MG tablet  Every 6 hours PRN        06/25/24 1109             Note:  This document was prepared using Dragon voice recognition software and may include unintentional dictation errors.   Rexford Reche HERO, MD 06/25/24 1119  "

## 2024-06-25 NOTE — ED Triage Notes (Signed)
 Pt to ED via POV from home. Pt reports pain to left side of face and ear x1 month. Pt reports unsure of tooth infection. Pt seen here on 12/26 and dx with Flu.

## 2024-06-25 NOTE — Discharge Instructions (Addendum)
 OPTIONS FOR DENTAL FOLLOW UP CARE  [employer] Health and Human Services - Local Safety Net Dental Clinics [email or web address]   [facility] [phone])  Maria Tapia [phone])  [place] [phone] ext [phone])  [facility] [phone])  [facility] [phone]) This clinic caters to the indigent population and is on a lottery system. Location: [employer] [facility], [employer], [address], [place] Clinic Hours: Wednesdays from 6pm - 9pm, patients seen by a lottery system. For dates, call or go to [email or web address] Services: Cleanings, fillings and simple extractions. Payment Options: DENTAL WORK IS FREE OF CHARGE. Bring proof of income or support. Best way to get seen: Arrive at 5:15 pm - this is a lottery, NOT first come/first serve, so arriving earlier will not increase your chances of being seen.     [facility] [phone] Select option 1 for emergencies   Location: [facility], [place], [address], [place] Clinic Hours: No walk-ins accepted - call the day before to schedule an appointment. Check in times are 9:30 am and 1:30 pm. Services: Simple extractions, temporary fillings, pulpectomy/pulp debridement, uncomplicated abscess drainage. Payment Options: PAYMENT IS DUE AT THE TIME OF SERVICE.  Fee is usually $100-200, additional surgical procedures (e.g. abscess drainage) may be extra. Cash, checks, Visa/MasterCard accepted.  Can file Medicaid if patient is covered for dental - patient should call case worker to check. No discount for [facility] patients. Best way to get seen: MUST call the day before and get onto the schedule. Can usually be seen the next 1-2 days. No walk-ins accepted.     [facility] [phone]    Location: [facility], [address], [place] Clinic Hours: M, W, Th, F 8am or 1:30pm, Tues 9a or 1:30 - first come/first served. Services: Simple extractions, temporary fillings, uncomplicated abscess drainage.  You do not need to be an [facility] resident. Payment Options: PAYMENT IS DUE AT THE TIME OF SERVICE. Dental insurance, otherwise sliding scale - bring proof of income or support. Depending on income and treatment needed, cost is usually $50-200. Best way to get seen: Arrive early as it is first come/first served.     [facility] [phone]   Location: [address] Clinic Hours: Mon-Thu 8a-5p Services: Most basic dental services including extractions and fillings. Payment Options: PAYMENT IS DUE AT THE TIME OF SERVICE. Sliding scale, up to 50% off - bring proof if income or support. Medicaid with dental option accepted. Best way to get seen: Call to schedule an appointment, can usually be seen within 2 weeks OR they will try to see walk-ins - show up at 8a or 2p (you may have to wait).     [facility] [phone] [place] RESIDENTS ONLY   Location: [facility], [address], [place], [place] [address] Clinic Hours: By appointment only. Monday - Thursday 8am-5pm, Friday 8am-12pm Services: Cleanings, fillings, extractions. Payment Options: PAYMENT IS DUE AT THE TIME OF SERVICE. Cash, Visa or MasterCard. Sliding scale - $30 minimum per service. Best way to get seen: Come in to office, complete packet and make an appointment - need proof of income or support monies for each household member and proof of [facility] residence. Usually takes about a month to get in.     [facility] [phone]   Location: [address]  [place] Clinic Hours: Walk-in Urgent Care Dental Services are offered Monday-Friday  mornings only. The numbers of emergencies accepted daily is limited to the number of providers available. Maximum 15 - Mondays, Wednesdays & Thursdays Maximum 10 - Tuesdays & Fridays Services: You do not need to be a [facility] resident to be seen for a dental emergency. Emergencies are defined as pain, swelling, abnormal bleeding, or dental trauma. Walkins will receive x-rays if needed. NOTE: Dental cleaning is not an emergency. Payment Options: PAYMENT IS DUE AT THE TIME OF SERVICE. Minimum co-pay is $40.00 for uninsured patients. Minimum co-pay is $3.00 for Medicaid with dental coverage. Dental Insurance is accepted and must be presented at time of visit. Medicare does not cover dental. Forms of payment: Cash, credit card, checks. Best way to get seen: If not previously registered with the clinic, walk-in dental registration begins at 7:15 am and is on a first come/first serve basis. If previously registered with the clinic, call to make an appointment.     [facility] [phone] [place] RESIDENTS ONLY   Location: [address] [place], [place] Clinic Hours: Mon-Thu 10a-2p Services: Extractions only! Payment Options: FREE (donations accepted) - bring proof of income or support Best way to get seen: Call and schedule an appointment OR come at 8am on the 1st Monday of every month (except for holidays) when it is first come/first served.     [facility] [phone]   Location: [address] [place], [place] Clinic Hours: Friday mornings Services, Payment Options, Best way to get seen: Call for info
